# Patient Record
Sex: Female | Born: 1960 | Race: White | Hispanic: No | Marital: Married | Smoking: Never smoker
Health system: Southern US, Community
[De-identification: ages and names within clinical notes are randomized; demographics above are authoritative.]

## PROBLEM LIST (undated history)

## (undated) DIAGNOSIS — C801 Malignant (primary) neoplasm, unspecified: Secondary | ICD-10-CM

## (undated) DIAGNOSIS — Z86018 Personal history of other benign neoplasm: Secondary | ICD-10-CM

## (undated) DIAGNOSIS — I10 Essential (primary) hypertension: Secondary | ICD-10-CM

## (undated) DIAGNOSIS — T7840XA Allergy, unspecified, initial encounter: Secondary | ICD-10-CM

## (undated) DIAGNOSIS — T753XXA Motion sickness, initial encounter: Secondary | ICD-10-CM

## (undated) DIAGNOSIS — Z923 Personal history of irradiation: Secondary | ICD-10-CM

## (undated) HISTORY — DX: Allergy, unspecified, initial encounter: T78.40XA

## (undated) HISTORY — DX: Essential (primary) hypertension: I10

## (undated) HISTORY — PX: TONSILLECTOMY: SUR1361

## (undated) HISTORY — DX: Personal history of other benign neoplasm: Z86.018

---

## 1999-04-26 HISTORY — PX: HIATAL HERNIA REPAIR: SHX195

## 2004-06-07 ENCOUNTER — Ambulatory Visit: Payer: Self-pay | Admitting: Family Medicine

## 2004-07-21 ENCOUNTER — Ambulatory Visit: Payer: Self-pay | Admitting: Family Medicine

## 2005-04-06 ENCOUNTER — Ambulatory Visit: Payer: Self-pay | Admitting: Family Medicine

## 2005-04-13 ENCOUNTER — Ambulatory Visit: Payer: Self-pay | Admitting: Family Medicine

## 2005-04-25 HISTORY — PX: ABDOMINAL HYSTERECTOMY: SHX81

## 2005-10-03 ENCOUNTER — Ambulatory Visit: Payer: Self-pay | Admitting: Obstetrics and Gynecology

## 2006-10-06 ENCOUNTER — Ambulatory Visit: Payer: Self-pay | Admitting: Obstetrics and Gynecology

## 2007-01-18 ENCOUNTER — Inpatient Hospital Stay: Payer: Self-pay | Admitting: Internal Medicine

## 2007-01-18 ENCOUNTER — Other Ambulatory Visit: Payer: Self-pay

## 2007-02-14 ENCOUNTER — Ambulatory Visit: Payer: Self-pay | Admitting: Gastroenterology

## 2007-07-18 ENCOUNTER — Ambulatory Visit: Payer: Self-pay | Admitting: Family Medicine

## 2008-09-17 ENCOUNTER — Emergency Department: Payer: Self-pay | Admitting: Emergency Medicine

## 2008-09-24 ENCOUNTER — Ambulatory Visit: Payer: Self-pay | Admitting: Family Medicine

## 2008-11-10 ENCOUNTER — Ambulatory Visit: Payer: Self-pay | Admitting: Ophthalmology

## 2008-11-27 ENCOUNTER — Ambulatory Visit: Payer: Self-pay | Admitting: Obstetrics and Gynecology

## 2008-12-18 ENCOUNTER — Ambulatory Visit: Payer: Self-pay | Admitting: Gastroenterology

## 2009-04-01 ENCOUNTER — Ambulatory Visit: Payer: Self-pay | Admitting: Family Medicine

## 2009-04-25 HISTORY — PX: OTHER SURGICAL HISTORY: SHX169

## 2010-01-11 ENCOUNTER — Ambulatory Visit: Payer: Self-pay | Admitting: Obstetrics and Gynecology

## 2010-02-03 ENCOUNTER — Encounter: Payer: Self-pay | Admitting: Family Medicine

## 2010-02-23 ENCOUNTER — Encounter: Payer: Self-pay | Admitting: Family Medicine

## 2010-03-25 ENCOUNTER — Encounter: Payer: Self-pay | Admitting: Family Medicine

## 2010-05-13 ENCOUNTER — Encounter: Payer: Self-pay | Admitting: Family Medicine

## 2010-05-26 ENCOUNTER — Encounter: Payer: Self-pay | Admitting: Family Medicine

## 2010-06-24 ENCOUNTER — Encounter: Payer: Self-pay | Admitting: Family Medicine

## 2010-11-18 ENCOUNTER — Ambulatory Visit: Payer: Self-pay | Admitting: Rheumatology

## 2011-02-15 ENCOUNTER — Ambulatory Visit: Payer: Self-pay | Admitting: Obstetrics and Gynecology

## 2011-07-17 ENCOUNTER — Emergency Department: Payer: Self-pay | Admitting: Emergency Medicine

## 2011-07-17 LAB — COMPREHENSIVE METABOLIC PANEL
Albumin: 4 g/dL (ref 3.4–5.0)
Alkaline Phosphatase: 68 U/L (ref 50–136)
Anion Gap: 11 (ref 7–16)
Bilirubin,Total: 0.3 mg/dL (ref 0.2–1.0)
Calcium, Total: 9.1 mg/dL (ref 8.5–10.1)
Chloride: 105 mmol/L (ref 98–107)
Co2: 27 mmol/L (ref 21–32)
Creatinine: 0.69 mg/dL (ref 0.60–1.30)
EGFR (African American): >60
EGFR (Non-African Amer.): >60
Glucose: 110 mg/dL — ABNORMAL HIGH (ref 65–99)
Osmolality: 286 (ref 275–301)
Potassium: 3.5 mmol/L (ref 3.5–5.1)
SGOT(AST): 26 U/L (ref 15–37)
SGPT (ALT): 22 U/L
Total Protein: 7.3 g/dL (ref 6.4–8.2)

## 2011-07-17 LAB — URINALYSIS, COMPLETE
Bacteria: NONE SEEN
Ketone: NEGATIVE
Leukocyte Esterase: NEGATIVE
Nitrite: NEGATIVE
Ph: 6 (ref 4.5–8.0)
RBC,UR: <1 /HPF (ref 0–5)
Squamous Epithelial: 2

## 2011-07-17 LAB — CBC
HGB: 13.8 g/dL (ref 12.0–16.0)
MCH: 28.8 pg (ref 26.0–34.0)
MCHC: 33.2 g/dL (ref 32.0–36.0)
MCV: 87 fL (ref 80–100)
RBC: 4.8 10*6/uL (ref 3.80–5.20)

## 2011-07-17 LAB — HEMOGLOBIN: HGB: 13.8 g/dL (ref 12.0–16.0)

## 2011-07-17 LAB — HEMATOCRIT: HCT: 41.9 % (ref 35.0–47.0)

## 2011-07-20 ENCOUNTER — Ambulatory Visit: Payer: Self-pay | Admitting: Gastroenterology

## 2011-07-31 ENCOUNTER — Emergency Department: Payer: Self-pay | Admitting: Emergency Medicine

## 2011-07-31 LAB — CBC
HCT: 38.9 % (ref 35.0–47.0)
HGB: 13 g/dL (ref 12.0–16.0)
MCH: 28.9 pg (ref 26.0–34.0)
MCV: 87 fL (ref 80–100)
Platelet: 262 10*3/uL (ref 150–440)
RBC: 4.49 10*6/uL (ref 3.80–5.20)
WBC: 10.8 10*3/uL (ref 3.6–11.0)

## 2011-07-31 LAB — COMPREHENSIVE METABOLIC PANEL
Albumin: 3.6 g/dL (ref 3.4–5.0)
Alkaline Phosphatase: 88 U/L (ref 50–136)
Anion Gap: 10 (ref 7–16)
Bilirubin,Total: 0.2 mg/dL (ref 0.2–1.0)
Calcium, Total: 9.1 mg/dL (ref 8.5–10.1)
Chloride: 103 mmol/L (ref 98–107)
Co2: 27 mmol/L (ref 21–32)
Creatinine: 0.7 mg/dL (ref 0.60–1.30)
EGFR (African American): >60
EGFR (Non-African Amer.): >60
Osmolality: 279 (ref 275–301)
Potassium: 3.6 mmol/L (ref 3.5–5.1)
SGOT(AST): 24 U/L (ref 15–37)
Total Protein: 6.7 g/dL (ref 6.4–8.2)

## 2011-07-31 LAB — URINALYSIS, COMPLETE
Bacteria: NONE SEEN
Bilirubin,UR: NEGATIVE
Leukocyte Esterase: NEGATIVE
Nitrite: NEGATIVE
Ph: 6 (ref 4.5–8.0)
Protein: NEGATIVE
RBC,UR: 1 /HPF (ref 0–5)
Specific Gravity: 1.009 (ref 1.003–1.030)
Squamous Epithelial: 2
WBC UR: <1 /HPF (ref 0–5)

## 2011-07-31 LAB — LIPASE, BLOOD: Lipase: 201 U/L (ref 73–393)

## 2011-08-17 ENCOUNTER — Ambulatory Visit: Payer: Self-pay | Admitting: Gastroenterology

## 2011-10-04 ENCOUNTER — Ambulatory Visit: Payer: Self-pay | Admitting: Gynecologic Oncology

## 2011-10-05 LAB — CA 125: CA 125: 27.8 U/mL (ref 0.0–34.0)

## 2011-10-24 ENCOUNTER — Ambulatory Visit: Payer: Self-pay | Admitting: Gynecologic Oncology

## 2012-01-10 ENCOUNTER — Ambulatory Visit: Payer: Self-pay | Admitting: Orthopedic Surgery

## 2012-02-21 ENCOUNTER — Ambulatory Visit: Payer: Self-pay | Admitting: Pain Medicine

## 2012-03-20 ENCOUNTER — Ambulatory Visit: Payer: Self-pay | Admitting: Family Medicine

## 2012-03-28 ENCOUNTER — Ambulatory Visit: Payer: Self-pay | Admitting: Pain Medicine

## 2012-05-08 ENCOUNTER — Ambulatory Visit: Payer: Self-pay | Admitting: Pain Medicine

## 2012-05-28 ENCOUNTER — Ambulatory Visit: Payer: Self-pay | Admitting: Pain Medicine

## 2012-05-29 ENCOUNTER — Ambulatory Visit: Payer: Self-pay | Admitting: Pain Medicine

## 2013-04-01 ENCOUNTER — Ambulatory Visit: Payer: Self-pay | Admitting: Family Medicine

## 2013-05-03 ENCOUNTER — Ambulatory Visit: Payer: Self-pay | Admitting: Family Medicine

## 2013-09-02 ENCOUNTER — Emergency Department: Payer: Self-pay | Admitting: Emergency Medicine

## 2013-09-02 LAB — CBC WITH DIFFERENTIAL/PLATELET
BASOS PCT: 0.5 %
Basophil #: 0.1 10*3/uL (ref 0.0–0.1)
EOS ABS: 0.2 10*3/uL (ref 0.0–0.7)
EOS PCT: 1.1 %
HCT: 46.7 % (ref 35.0–47.0)
HGB: 15.1 g/dL (ref 12.0–16.0)
LYMPHS ABS: 2.7 10*3/uL (ref 1.0–3.6)
Lymphocyte %: 18.4 %
MCH: 28.3 pg (ref 26.0–34.0)
MCHC: 32.4 g/dL (ref 32.0–36.0)
MCV: 87 fL (ref 80–100)
MONOS PCT: 5 %
Monocyte #: 0.7 x10 3/mm (ref 0.2–0.9)
NEUTROS PCT: 75 %
Neutrophil #: 11.2 10*3/uL — ABNORMAL HIGH (ref 1.4–6.5)
PLATELETS: 282 10*3/uL (ref 150–440)
RBC: 5.36 10*6/uL — ABNORMAL HIGH (ref 3.80–5.20)
RDW: 14.6 % — ABNORMAL HIGH (ref 11.5–14.5)
WBC: 14.9 10*3/uL — ABNORMAL HIGH (ref 3.6–11.0)

## 2013-09-02 LAB — COMPREHENSIVE METABOLIC PANEL
ALBUMIN: 4 g/dL (ref 3.4–5.0)
Alkaline Phosphatase: 85 U/L
Anion Gap: 3 — ABNORMAL LOW (ref 7–16)
BUN: 10 mg/dL (ref 7–18)
Bilirubin,Total: 0.3 mg/dL (ref 0.2–1.0)
CALCIUM: 9.3 mg/dL (ref 8.5–10.1)
CHLORIDE: 105 mmol/L (ref 98–107)
CO2: 29 mmol/L (ref 21–32)
CREATININE: 0.78 mg/dL (ref 0.60–1.30)
EGFR (African American): >60
Glucose: 91 mg/dL (ref 65–99)
Osmolality: 272 (ref 275–301)
POTASSIUM: 4.3 mmol/L (ref 3.5–5.1)
SGOT(AST): 31 U/L (ref 15–37)
SGPT (ALT): 31 U/L (ref 12–78)
Sodium: 137 mmol/L (ref 136–145)
Total Protein: 7.3 g/dL (ref 6.4–8.2)

## 2013-09-02 LAB — URINALYSIS, COMPLETE
Bacteria: NONE SEEN
Bilirubin,UR: NEGATIVE
GLUCOSE, UR: NEGATIVE mg/dL (ref 0–75)
KETONE: NEGATIVE
LEUKOCYTE ESTERASE: NEGATIVE
Nitrite: NEGATIVE
Ph: 7 (ref 4.5–8.0)
Protein: NEGATIVE
SPECIFIC GRAVITY: 1.009 (ref 1.003–1.030)
WBC UR: 1 /HPF (ref 0–5)

## 2013-09-02 LAB — LIPASE, BLOOD: Lipase: 183 U/L (ref 73–393)

## 2014-04-14 ENCOUNTER — Emergency Department: Payer: Self-pay | Admitting: Emergency Medicine

## 2014-04-14 LAB — URINALYSIS, COMPLETE
BILIRUBIN, UR: NEGATIVE
BLOOD: NEGATIVE
Bacteria: NONE SEEN
GLUCOSE, UR: NEGATIVE mg/dL (ref 0–75)
KETONE: NEGATIVE
LEUKOCYTE ESTERASE: NEGATIVE
Nitrite: NEGATIVE
PH: 6 (ref 4.5–8.0)
RBC,UR: 4 /HPF (ref 0–5)
Specific Gravity: 1.023 (ref 1.003–1.030)
Squamous Epithelial: 5

## 2014-04-14 LAB — TROPONIN I

## 2014-04-14 LAB — CBC WITH DIFFERENTIAL/PLATELET
Basophil #: 0.1 10*3/uL (ref 0.0–0.1)
Basophil %: 0.4 %
Eosinophil #: 0.1 10*3/uL (ref 0.0–0.7)
Eosinophil %: 0.5 %
HCT: 47.8 % — AB (ref 35.0–47.0)
HGB: 15.5 g/dL (ref 12.0–16.0)
Lymphocyte #: 1.6 10*3/uL (ref 1.0–3.6)
Lymphocyte %: 10.4 %
MCH: 28.6 pg (ref 26.0–34.0)
MCHC: 32.5 g/dL (ref 32.0–36.0)
MCV: 88 fL (ref 80–100)
Monocyte #: 0.7 x10 3/mm (ref 0.2–0.9)
Monocyte %: 4.3 %
NEUTROS ABS: 12.8 10*3/uL — AB (ref 1.4–6.5)
Neutrophil %: 84.4 %
Platelet: 343 10*3/uL (ref 150–440)
RBC: 5.44 10*6/uL — ABNORMAL HIGH (ref 3.80–5.20)
RDW: 14.1 % (ref 11.5–14.5)
WBC: 15.2 10*3/uL — ABNORMAL HIGH (ref 3.6–11.0)

## 2014-04-14 LAB — COMPREHENSIVE METABOLIC PANEL
ALBUMIN: 4 g/dL (ref 3.4–5.0)
ALT: 20 U/L
Alkaline Phosphatase: 78 U/L
Anion Gap: 8 (ref 7–16)
BILIRUBIN TOTAL: 0.6 mg/dL (ref 0.2–1.0)
BUN: 14 mg/dL (ref 7–18)
Calcium, Total: 9.4 mg/dL (ref 8.5–10.1)
Chloride: 105 mmol/L (ref 98–107)
Co2: 26 mmol/L (ref 21–32)
Creatinine: 0.82 mg/dL (ref 0.60–1.30)
EGFR (African American): >60
GLUCOSE: 116 mg/dL — AB (ref 65–99)
Osmolality: 279 (ref 275–301)
POTASSIUM: 4 mmol/L (ref 3.5–5.1)
SGOT(AST): 26 U/L (ref 15–37)
SODIUM: 139 mmol/L (ref 136–145)
TOTAL PROTEIN: 7.3 g/dL (ref 6.4–8.2)

## 2014-04-14 LAB — LIPASE, BLOOD: LIPASE: 137 U/L (ref 73–393)

## 2014-07-15 ENCOUNTER — Ambulatory Visit: Payer: Self-pay | Admitting: Family Medicine

## 2014-09-26 ENCOUNTER — Telehealth: Payer: Self-pay | Admitting: Family Medicine

## 2014-09-26 NOTE — Telephone Encounter (Signed)
Patient does not have Xanax in all script or in Epic.

## 2014-09-26 NOTE — Telephone Encounter (Signed)
Pt is requesting a refill for the Rx Xanax.  [facility].  [phone] or [phone]

## 2014-09-26 NOTE — Telephone Encounter (Signed)
Please add medication.

## 2014-09-28 NOTE — Telephone Encounter (Signed)
Please call patient and clarify dose and frequency. Not in our records. Thanks.

## 2014-09-29 MED ORDER — ALPRAZOLAM 0.5 MG PO TABS: 0.5000 mg | ORAL_TABLET | Freq: Two times a day (BID) | ORAL | Status: DC | PRN

## 2014-09-29 NOTE — Telephone Encounter (Signed)
Please call patient and let her know we have printed rx.    Can not send to pharmacy.  Thanks.

## 2014-09-29 NOTE — Telephone Encounter (Signed)
Pt called back stating the doseage for Xanax 0.5 and as needed.  NK#[phone]/FX

## 2014-09-29 NOTE — Telephone Encounter (Signed)
I spoke with Ms. Kren, she says its a prescription from 2006.  She only uses it when she travels.  She does not have the RX with her, so she will call back with the dose and frequency.   Thanks,   -Mickel Baas

## 2014-09-30 ENCOUNTER — Other Ambulatory Visit: Payer: Self-pay | Admitting: Family Medicine

## 2014-09-30 DIAGNOSIS — F419 Anxiety disorder, unspecified: Secondary | ICD-10-CM

## 2014-09-30 MED ORDER — ALPRAZOLAM 0.5 MG PO TABS: 0.5000 mg | ORAL_TABLET | Freq: Two times a day (BID) | ORAL | Status: DC | PRN

## 2014-10-27 ENCOUNTER — Other Ambulatory Visit: Payer: Self-pay | Admitting: Family Medicine

## 2014-11-23 ENCOUNTER — Other Ambulatory Visit: Payer: Self-pay | Admitting: Family Medicine

## 2014-11-24 NOTE — Telephone Encounter (Signed)
Last Ov was on 06/12/2014

## 2014-12-17 ENCOUNTER — Other Ambulatory Visit: Payer: Self-pay | Admitting: Family Medicine

## 2014-12-17 DIAGNOSIS — J309 Allergic rhinitis, unspecified: Secondary | ICD-10-CM

## 2015-02-09 ENCOUNTER — Telehealth: Payer: Self-pay | Admitting: Family Medicine

## 2015-02-09 DIAGNOSIS — E213 Hyperparathyroidism, unspecified: Secondary | ICD-10-CM | POA: Insufficient documentation

## 2015-02-10 ENCOUNTER — Other Ambulatory Visit: Payer: Self-pay | Admitting: Family Medicine

## 2015-02-10 NOTE — Telephone Encounter (Signed)
Patient with elevated PTH, needs scan to see if over active  Parathyroid gland. Ordered and they will call patient. Please let her know. Also, is post-menopausal and no markers for arthritis. Please notify patient.  Thanks.

## 2015-02-10 NOTE — Telephone Encounter (Signed)
LMTCB 02/10/2015  Thanks,   -Laura 

## 2015-02-16 NOTE — Telephone Encounter (Signed)
Lmtcb-aa 

## 2015-02-16 NOTE — Telephone Encounter (Signed)
Pt advised-aa 

## 2015-02-17 ENCOUNTER — Encounter
Admission: RE | Admit: 2015-02-17 | Discharge: 2015-02-17 | Disposition: A | Discharge status: Home / Self Care | Payer: BLUE CROSS/BLUE SHIELD | Source: Ambulatory Visit | Attending: Family Medicine | Admitting: Family Medicine

## 2015-02-17 ENCOUNTER — Ambulatory Visit: Payer: Self-pay

## 2015-02-17 DIAGNOSIS — E213 Hyperparathyroidism, unspecified: Secondary | ICD-10-CM | POA: Diagnosis not present

## 2015-02-17 MED ORDER — TECHNETIUM TC 99M SESTAMIBI - CARDIOLITE
26.3440 | Freq: Once | INTRAVENOUS | Status: AC | PRN
  Administered 2015-02-17: 26.344 via INTRAVENOUS

## 2015-02-18 ENCOUNTER — Telehealth: Payer: Self-pay

## 2015-02-18 DIAGNOSIS — E213 Hyperparathyroidism, unspecified: Secondary | ICD-10-CM

## 2015-02-18 NOTE — Telephone Encounter (Signed)
Pt advised as directed below; lab sheet is at the front desk.   Thanks,   -Mickel Baas

## 2015-02-18 NOTE — Telephone Encounter (Signed)
-----   Message from Margarita Rana, MD sent at 02/17/2015  3:57 PM EDT ----- Negative for parathyroid adenoma. Would recheck PTH and will need endocrinology referral if still elevated. Thanks.

## 2015-02-21 ENCOUNTER — Other Ambulatory Visit: Payer: Self-pay | Admitting: Family Medicine

## 2015-02-21 DIAGNOSIS — I10 Essential (primary) hypertension: Secondary | ICD-10-CM

## 2015-02-21 LAB — PTH, INTACT AND CALCIUM
CALCIUM: 10.5 mg/dL — AB (ref 8.7–10.2)
PTH: 85 pg/mL — ABNORMAL HIGH (ref 15–65)

## 2015-02-23 DIAGNOSIS — I10 Essential (primary) hypertension: Secondary | ICD-10-CM | POA: Insufficient documentation

## 2015-02-24 ENCOUNTER — Telehealth: Payer: Self-pay

## 2015-02-24 ENCOUNTER — Encounter: Payer: Self-pay | Admitting: Family Medicine

## 2015-02-24 ENCOUNTER — Telehealth: Payer: Self-pay | Admitting: Family Medicine

## 2015-02-24 NOTE — Telephone Encounter (Signed)
Pt states she would like to know what her blood test results are from last Friday 02-20-15 if possible. CC

## 2015-02-24 NOTE — Telephone Encounter (Signed)
Pt advised;  She is going to call back with the Endocrinologist that she wants to see.   Thanks,   -Mickel Baas

## 2015-02-24 NOTE — Telephone Encounter (Signed)
-----   Message from Margarita Rana, MD sent at 02/21/2015 10:04 AM EDT ----- Calcium elevated and PTH still elevated. Needs referral to endocrinology. Please notify patient and put in order. Thanks.

## 2015-02-24 NOTE — Telephone Encounter (Signed)
Pt advised of lab work. ° °Thanks,  ° °-Hodan Wurtz  °

## 2015-02-25 ENCOUNTER — Telehealth: Payer: Self-pay | Admitting: Family Medicine

## 2015-02-25 DIAGNOSIS — E349 Endocrine disorder, unspecified: Secondary | ICD-10-CM

## 2015-02-25 NOTE — Telephone Encounter (Signed)
Pt states that she is supposed to be referred to an endocrinologist.Please add referral to EPIC.She would like to see Dr Gabriel Carina

## 2015-02-25 NOTE — Telephone Encounter (Signed)
Which diagnosis should we use to associate endocrinology referral? Elevated PTH? Please advise. Thanks!

## 2015-02-25 NOTE — Telephone Encounter (Signed)
Yes. Thanks 

## 2015-02-26 NOTE — Telephone Encounter (Signed)
Order for endocrinology referral added in EPIC. Thanks!

## 2015-03-23 ENCOUNTER — Telehealth: Payer: Self-pay

## 2015-03-23 MED ORDER — METOPROLOL SUCCINATE ER 50 MG PO TB24: 50.0000 mg | ORAL_TABLET | Freq: Every day | ORAL | Status: DC

## 2015-03-23 NOTE — Telephone Encounter (Signed)
Patient reports that she would like to increase Metoprolol. [facility] Marina Gravel!

## 2015-03-23 NOTE — Telephone Encounter (Signed)
Can increase Metoprolol as is on low dose or add low dose amlodipine instead. Thanks.

## 2015-03-23 NOTE — Telephone Encounter (Signed)
Pt reports Dr. Gabriel Carina advised pt to D/C HCTZ (as well as all other diuretics) due to "mild hyperparathyroidism". Pt is concerned because her BP has been elevated. It was 164/101 this morning. Pt reports Dr. Gabriel Carina is aware of her being elevated. Please advise. Renaldo Fiddler, CMA

## 2015-06-08 ENCOUNTER — Ambulatory Visit (INDEPENDENT_AMBULATORY_CARE_PROVIDER_SITE_OTHER): Payer: BLUE CROSS/BLUE SHIELD | Admitting: Family Medicine

## 2015-06-08 ENCOUNTER — Encounter: Payer: Self-pay | Admitting: Family Medicine

## 2015-06-08 VITALS — BP 148/96 | HR 64 | Temp 98.5°F | Resp 16 | Ht 65.0 in | Wt 146.0 lb

## 2015-06-08 DIAGNOSIS — K589 Irritable bowel syndrome without diarrhea: Secondary | ICD-10-CM | POA: Diagnosis not present

## 2015-06-08 DIAGNOSIS — J302 Other seasonal allergic rhinitis: Secondary | ICD-10-CM | POA: Insufficient documentation

## 2015-06-08 DIAGNOSIS — M1990 Unspecified osteoarthritis, unspecified site: Secondary | ICD-10-CM | POA: Insufficient documentation

## 2015-06-08 DIAGNOSIS — I10 Essential (primary) hypertension: Secondary | ICD-10-CM | POA: Diagnosis not present

## 2015-06-08 DIAGNOSIS — K922 Gastrointestinal hemorrhage, unspecified: Secondary | ICD-10-CM | POA: Insufficient documentation

## 2015-06-08 DIAGNOSIS — H04129 Dry eye syndrome of unspecified lacrimal gland: Secondary | ICD-10-CM | POA: Insufficient documentation

## 2015-06-08 DIAGNOSIS — K219 Gastro-esophageal reflux disease without esophagitis: Secondary | ICD-10-CM | POA: Insufficient documentation

## 2015-06-08 MED ORDER — METOPROLOL SUCCINATE ER 50 MG PO TB24: 50.0000 mg | ORAL_TABLET | Freq: Every day | ORAL | Status: DC

## 2015-06-08 MED ORDER — LISINOPRIL 10 MG PO TABS: 10.0000 mg | ORAL_TABLET | Freq: Every day | ORAL | Status: DC

## 2015-06-08 NOTE — Progress Notes (Signed)
Patient ID: RENELLA Bradshaw, female   DOB: 1960-12-31, 55 y.o.   MRN: [id number]         Patient: Jessica Bradshaw Female    DOB: 1960/11/05   55 y.o.   MRN: [id number] Visit Date: 06/08/2015  Today's Provider: Margarita Rana, MD   Chief Complaint  Patient presents with  . Hypertension   Subjective:    Hypertension This is a chronic problem. The problem has been gradually improving since onset. The problem is uncontrolled. Associated symptoms include headaches. Pertinent negatives include no chest pain, malaise/fatigue, neck pain, orthopnea, palpitations, peripheral edema, shortness of breath or sweats.   Pt reports D/C'ing HCTZ and increasing Metoprolol from 16m to 51msecondary to Hyperparathyroidism.  Pt says her blood pressure have been elevated since the medications change.  (180's-190's over high 90's low 100's).  [facility] double her Metoprolol to 10090m She says her blood pressure is still running high.      Allergies  Allergen Reactions  . Esomeprazole     Other reaction(s): Chest pain, Cough   Previous Medications   ALPRAZOLAM (XANAX) 0.5 MG TABLET    Take 1 tablet (0.5 mg total) by mouth 2 (two) times daily as needed for anxiety.   FLUTICASONE (FLONASE) 50 MCG/ACT NASAL SPRAY    USE 2 SPRAYS IN EACH NOSTRIL ONCE DAILY   LINZESS 290 MCG CAPS CAPSULE    TAKE 1 CAPSULE BY MOUTH ONCE DAILY.   METOPROLOL SUCCINATE (TOPROL-XL) 100 MG 24 HR TABLET    Take 100 mg by mouth daily. Take with or immediately following a meal.   METOPROLOL SUCCINATE (TOPROL-XL) 50 MG 24 HR TABLET    Take 1 tablet (50 mg total) by mouth daily. Take with or immediately following a meal.   MONTELUKAST (SINGULAIR) 10 MG TABLET    TAKE 1 TABLET EVERY DAY   XIIDRA 5 % SOLN    USE 1 DROP IN BOTH EYES 2 TIMES A DAY    Review of Systems  Constitutional: Positive for unexpected weight change (Pt reports gaining 10 pounds in two days three weeks ago.   She says she hasn't lost it. ). Negative for fever, chills, malaise/fatigue, diaphoresis, activity change, appetite change and fatigue.  Respiratory: Positive for chest tightness. Negative for apnea, cough, choking, shortness of breath, wheezing and stridor.   Cardiovascular: Negative for chest pain, palpitations, orthopnea and leg swelling.  Gastrointestinal: Positive for nausea, abdominal pain and constipation. Negative for vomiting, diarrhea, blood in stool, anal bleeding and rectal pain.  Musculoskeletal: Negative.  Negative for neck pain.  Neurological: Positive for headaches. Negative for dizziness and light-headedness.    Social History  Substance Use Topics  . Smoking status: Never Smoker   . Smokeless tobacco: Never Used  . Alcohol Use: No   Objective:   BP 148/96 mmHg  Pulse 64  Temp(Src) 98.5 F (36.9 C) (Oral)  Resp 16  Ht 5' 5"  (1.651 m)  Wt 146 lb (66.225 kg)  BMI 24.30 kg/m2  Physical Exam  Constitutional: She is oriented to person, place, and time. She appears well-developed and well-nourished.  Cardiovascular: Normal rate, regular rhythm and normal heart sounds.   Pulmonary/Chest: Effort normal and breath sounds normal.  Neurological: She is alert and oriented to person, place, and time.  Psychiatric: She has a normal mood and affect. Her behavior is normal. Judgment and thought content normal.        Assessment & Plan:  1. IBS (irritable bowel syndrome) Followed by [facility].  Will return to see them at the end of the month.    2. Essential hypertension BP still elevated, will decrease Metoprolol back to 30m and start Lisinopril 13m  Continue to monitor at home and recheck a Met C in about a month.    - lisinopril (PRINIVIL,ZESTRIL) 10 MG tablet; Take 1 tablet (10 mg total) by mouth daily.  Dispense: 90 tablet; Refill: 1 - metoprolol succinate (TOPROL-XL) 50 MG 24 hr tablet; Take 1 tablet (50 mg total) by mouth daily. Take with or immediately  following a meal.  Dispense: 90 tablet; Refill: 1     Patient was seen and examined by NaJerrell BelfastMD, and note scribed by LaAshley RoyaltyCMA.  I have reviewed the document for accuracy and completeness and I agree with above. - NaJerrell BelfastMD   NaMargarita RanaMD  [facility]

## 2015-06-13 ENCOUNTER — Telehealth: Payer: Self-pay | Admitting: Emergency Medicine

## 2015-06-13 NOTE — Telephone Encounter (Signed)
Pt called because she was seen earlier this week for elevated BP. She was started on Lisinopril 10 mg. Today she reports that her BP is too low. She was instructed to cut her Metoprolol in half and if it was still too low then to cut the Lisinopril in half. Pt voiced understanding.

## 2015-07-01 ENCOUNTER — Telehealth: Payer: Self-pay | Admitting: Family Medicine

## 2015-07-01 DIAGNOSIS — I10 Essential (primary) hypertension: Secondary | ICD-10-CM

## 2015-07-01 MED ORDER — LISINOPRIL 20 MG PO TABS: 20.0000 mg | ORAL_TABLET | Freq: Every day | ORAL | Status: DC

## 2015-07-01 NOTE — Telephone Encounter (Signed)
LMTCB 07/01/2015  Thanks,   -Mickel Baas

## 2015-07-01 NOTE — Telephone Encounter (Signed)
Pt stated that she is taking the full dose of both metoprolol succinate (TOPROL-XL) 50 MG 24 hr tablet & lisinopril (PRINIVIL,ZESTRIL) 10 MG tablet and she doesn't feel like her BP has changed much. Pt stated it stays b/t 144/88 & 144/100. Pt wanted to see if she should make any changes to her medication. Thanks TNP

## 2015-07-01 NOTE — Telephone Encounter (Signed)
Pt advised-aa 

## 2015-07-01 NOTE — Telephone Encounter (Signed)
LOV was 06/08/2015 and BP was 148/96. Pt had to D/C HCTZ due to hyperparathyroidism. Please advise. Renaldo Fiddler, CMA

## 2015-07-01 NOTE — Telephone Encounter (Signed)
Increase Lisinopril to 20 mg.  Continue to monitor. Thanks.

## 2015-07-18 ENCOUNTER — Other Ambulatory Visit: Payer: Self-pay | Admitting: Family Medicine

## 2015-07-18 DIAGNOSIS — J302 Other seasonal allergic rhinitis: Secondary | ICD-10-CM

## 2015-07-20 ENCOUNTER — Telehealth: Payer: Self-pay | Admitting: Family Medicine

## 2015-07-20 DIAGNOSIS — I10 Essential (primary) hypertension: Secondary | ICD-10-CM

## 2015-07-20 MED ORDER — LOSARTAN POTASSIUM 50 MG PO TABS: 50.0000 mg | ORAL_TABLET | Freq: Every day | ORAL | Status: DC

## 2015-07-20 NOTE — Telephone Encounter (Signed)
Pt advised.  [facility].  Thanks,   -Paddy Walthall  

## 2015-07-20 NOTE — Telephone Encounter (Signed)
Pt stated that she has developed a cough and believes that it is a side effect of taking lisinopril (PRINIVIL,ZESTRIL) 20 MG tablet. Pt would like something else sent to [facility] Abbott Pao was advised that Dr. Venia Minks is out of the office this week. Please advise. Thanks TNP

## 2015-07-20 NOTE — Telephone Encounter (Signed)
Please review.    Thanks,   -Enzo Treu  

## 2015-07-20 NOTE — Telephone Encounter (Signed)
Losartan 50 mg daily

## 2015-07-27 ENCOUNTER — Telehealth: Payer: Self-pay | Admitting: Family Medicine

## 2015-07-27 DIAGNOSIS — K589 Irritable bowel syndrome without diarrhea: Secondary | ICD-10-CM

## 2015-07-27 DIAGNOSIS — K5909 Other constipation: Secondary | ICD-10-CM

## 2015-07-27 NOTE — Telephone Encounter (Signed)
Pt stated she was referred to Dr. Marijo File at the [facility] in [place]. Pt stated she was then referred to Dr. Jerl Mina a Pelvic Health Physical Therapist with [facility] for constipation. Pt stated that the hospital won't accept an out of state referral and pt would like Dr. Venia Minks to send the referral. Pt stated she would be happy to fax her records from [facility] if we need them. Pt stated the referral needs to be faxed to [phone]. Please advise. Thanks TNP

## 2015-07-28 NOTE — Telephone Encounter (Signed)
Ok to refer. Thanks

## 2015-07-28 NOTE — Telephone Encounter (Signed)
That is correct. Thanks

## 2015-07-28 NOTE — Telephone Encounter (Signed)
GI referral for IBS. [facility] was referring pt to Dr. Jerl Mina for constipation. Ok to put in this referral? Jessica Bradshaw, CMA

## 2015-07-28 NOTE — Telephone Encounter (Signed)
Hanley Seamen is a physical therapist at [facility]

## 2015-07-28 NOTE — Telephone Encounter (Signed)
It looks like from the note that pt is requesting referral for physical therapy.The order in EPIC is for GI.Is this correct ?

## 2015-07-29 DIAGNOSIS — K5909 Other constipation: Secondary | ICD-10-CM | POA: Insufficient documentation

## 2015-07-29 NOTE — Telephone Encounter (Signed)
Pt called to get an update on her referral to for physical therapy (please see first message taken on 07/27/15). I spoke with Judson Roch and she had sent over the GI referral but pt is needing it to be for physical therapy. Please advise. Thanks TNP

## 2015-07-29 NOTE — Telephone Encounter (Signed)
PT referral ordered. Emily Drozdowski, CMA  

## 2015-08-12 ENCOUNTER — Encounter: Payer: Self-pay | Admitting: Family Medicine

## 2015-08-12 ENCOUNTER — Telehealth: Payer: Self-pay | Admitting: Family Medicine

## 2015-08-12 ENCOUNTER — Ambulatory Visit (INDEPENDENT_AMBULATORY_CARE_PROVIDER_SITE_OTHER): Payer: BLUE CROSS/BLUE SHIELD | Admitting: Family Medicine

## 2015-08-12 VITALS — BP 122/70 | HR 72 | Temp 98.5°F | Resp 16 | Wt 144.0 lb

## 2015-08-12 DIAGNOSIS — K589 Irritable bowel syndrome without diarrhea: Secondary | ICD-10-CM

## 2015-08-12 DIAGNOSIS — K648 Other hemorrhoids: Secondary | ICD-10-CM

## 2015-08-12 DIAGNOSIS — K644 Residual hemorrhoidal skin tags: Secondary | ICD-10-CM

## 2015-08-12 MED ORDER — HYDROCORTISONE 2.5 % RE CREA: 1.0000 "application " | TOPICAL_CREAM | Freq: Two times a day (BID) | RECTAL | Status: DC

## 2015-08-12 NOTE — Telephone Encounter (Signed)
Could not find a covered med. Thanks.

## 2015-08-12 NOTE — Telephone Encounter (Signed)
[facility] is stating that the Proctosol 2.5% rectal cream is not covered under the patient's plan. She is wondering if there is a different med that you can prescribe for the patient. Patient is at the pharmacy now and wants to wait until something is heard back from Dr. Venia Minks. Advised the pharmacist that Dr. was busy with patients but she insisted that Dr. or nurse give call back ASAP. Ronny Bacon --[phone]

## 2015-08-12 NOTE — Progress Notes (Signed)
Subjective:    Patient ID: Jessica Bradshaw, female    DOB: Dec 13, 1960, 55 y.o.   MRN: [id number]  HPI  Hemorrhoids: Patient complains of evaluation of possible hemorrhoids. Onset of symptoms was abrupt starting 5 months ago ago with gradually worsening course since that time.  She describes symptoms as anorectal itching, pain with sitting and painful defecation. Treatment to date has been Preparation H, sitz baths, witch hazel. Patient denies family hx of colorectal CA, history of previous STDs, known or suspected STD exposure, maroon colored stools, melena, receptive anal intercourse and weight loss.  Still with constipation on Linzess and Miralax. Is going to see a pelvic floor specialist next month.  May be making her hemorrhoids worse.   Review of Systems  Constitutional: Negative for fever, chills, diaphoresis, activity change, appetite change, fatigue and unexpected weight change.  Gastrointestinal: Positive for constipation (controlled on Linzess or Miralax.  ) and rectal pain. Negative for blood in stool and anal bleeding. Diarrhea: controlled on Linzess.   BP 122/70 mmHg  Pulse 72  Temp(Src) 98.5 F (36.9 C) (Oral)  Resp 16  Wt 144 lb (65.318 kg)   Patient Active Problem List   Diagnosis Date Noted  . Chronic constipation 07/29/2015  . Dry eye syndrome 06/08/2015  . Acid reflux 06/08/2015  . Arthritis, degenerative 06/08/2015  . Allergic rhinitis, seasonal 06/08/2015  . Gastrointestinal bleeding, upper 06/08/2015  . IBS (irritable bowel syndrome) 06/08/2015  . BP (high blood pressure) 02/23/2015  . Hyperparathyroidism ([place]) 02/09/2015   No past medical history on file. Current Outpatient Prescriptions on File Prior to Visit  Medication Sig  . ALPRAZolam (XANAX) 0.5 MG tablet Take 1 tablet (0.5 mg total) by mouth 2 (two) times daily as needed for anxiety.  . fluticasone (FLONASE) 50 MCG/ACT nasal spray USE 2 SPRAYS IN EACH NOSTRIL ONCE DAILY  . LINZESS 290 MCG  CAPS capsule TAKE 1 CAPSULE BY MOUTH ONCE DAILY.  Marland Kitchen losartan (COZAAR) 50 MG tablet Take 1 tablet (50 mg total) by mouth daily.  . metoprolol succinate (TOPROL-XL) 50 MG 24 hr tablet Take 1 tablet (50 mg total) by mouth daily. Take with or immediately following a meal.  . montelukast (SINGULAIR) 10 MG tablet TAKE 1 TABLET EVERY DAY   No current facility-administered medications on file prior to visit.   Allergies  Allergen Reactions  . Esomeprazole     Other reaction(s): Chest pain, Cough   Past Surgical History  Procedure Laterality Date  . Tonsillectomy    . Cesarean section  1985  . Abdominal hysterectomy  2007  . Hiatal hernia repair  2001   Social History   Social History  . Marital Status: Married    Spouse Name: N/A  . Number of Children: 3  . Years of Education: College   Occupational History  . Part Time    Social History Main Topics  . Smoking status: Never Smoker   . Smokeless tobacco: Never Used  . Alcohol Use: No  . Drug Use: No  . Sexual Activity: Not on file   Other Topics Concern  . Not on file   Social History Narrative   Family History  Problem Relation Age of Onset  . Osteoporosis Mother   . Diabetes Father   . Heart disease Father   . Congestive Heart Failure Father   . Hypertension Father   . Stroke Father   . Heart attack Father   . Glaucoma Father   . AAA (abdominal aortic aneurysm)  Son   . HIV Brother   . Hypertension Brother   . Hypertension Brother   . Drug abuse Maternal Uncle       Objective:   Physical Exam  Constitutional: She is oriented to person, place, and time. She appears well-developed and well-nourished.  Neurological: She is alert and oriented to person, place, and time.  Psychiatric: She has a normal mood and affect. Her behavior is normal. Judgment and thought content normal.   BP 122/70 mmHg  Pulse 72  Temp(Src) 98.5 F (36.9 C) (Oral)  Resp 16  Wt 144 lb (65.318 kg)     Assessment & Plan:  1. External  hemorrhoids New problem. Worsening. Did see surgeon at [facility] who told her they were too small for them to treat. Will treat as below.  Patient instructed to call back if condition worsens or does not improve.    - hydrocortisone (ANUSOL-HC) 2.5 % rectal cream; Place 1 application rectally 2 (two) times daily.  Dispense: 30 g; Refill: 0  2. IBS (irritable bowel syndrome) Improved but not controlled on Miralax and Linzess.  Continue for now.    Margarita Rana, MD

## 2015-09-04 ENCOUNTER — Encounter: Payer: Self-pay | Admitting: Physical Therapy

## 2015-09-04 ENCOUNTER — Ambulatory Visit: Payer: BLUE CROSS/BLUE SHIELD | Attending: Family Medicine | Admitting: Physical Therapy

## 2015-09-04 DIAGNOSIS — M6281 Muscle weakness (generalized): Secondary | ICD-10-CM

## 2015-09-04 DIAGNOSIS — R279 Unspecified lack of coordination: Secondary | ICD-10-CM | POA: Diagnosis not present

## 2015-09-04 DIAGNOSIS — R293 Abnormal posture: Secondary | ICD-10-CM

## 2015-09-04 NOTE — Therapy (Addendum)
[place] MAIN [facility] SERVICES [address] [place], [place], [address] Phone: [phone]   Fax:  [phone]  Physical Therapy Evaluation  Patient Details  Name: Jessica Bradshaw MRN: [id number] Date of Birth: 27-Feb-1961 Referring Provider: Venia Minks  Encounter Date: 09/04/2015      PT End of Session - 09/07/15 [id number]    Visit Number 1   Number of Visits 12   Date for PT Re-Evaluation 11/23/15   PT Start Time 1310   PT Stop Time 1408   PT Time Calculation (min) 58 min   Activity Tolerance Patient tolerated treatment well;No increased pain   Behavior During Therapy [facility] for tasks assessed/performed      Past Medical History  Diagnosis Date  . Hypertension   . Allergy     Past Surgical History  Procedure Laterality Date  . Tonsillectomy    . Abdominal hysterectomy  2007  . Hiatal hernia repair  2001  . Cesarean section  1985  . Transoral incisionless fundoplication  [id number]    There were no vitals filed for this visit.       Subjective Assessment - 09/07/15 0833    Subjective 1) Pt was diagnosed with IBS - constipation 3 years ago but has had constipation as a kid.  This Sx has worsened with the past 6 years. Pt reports straining to evacaute all of her life but currently with intake of laxatives: (Lincess and Miralex) she no longer has to strain and has 1 BM daily. Without medications, pt's bowel schedule involves 1 BM with small rock formation once a day. Pt's comfortable bowel movement Type is 6-7.  When she has Type 4-5, she gets "backed up" and has been  hospitalized 1-2 x / year. Daily fluid intake : 24 fl oz of water, 16 fl oz of decaf tea, 8 fl oz coffee, 6 floz orange juice.  Diet:  Special Carbohydrate Diet for SIBO. But she finds this diet difficult to be in social environment.  2) LBP since 3-4 months after undergoing PT and dry needling Tx for B hip pain R > L.  The PT who treated her told her that her L side is tighter.  The LBP hurts with prolonged sitting > 1 hr, walking 1 mile. Pt stopped walking 3-6 miles day , 5-7 x week due to pain.  Tenderness to touch along R lateral thigh.  Pt is no longer getting PT.      Pertinent History Hx of fibroids, leading hysterectomy (abdominal). also TIF, and emergency C-section. SIBO 3x with antibiotics Tx in the past. 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  Pt injuried her neck (herniated disk) with lifting son.    Patient Stated Goals return to being able to active without pain (walking, treadmilling, gym classes)             [facility] PT Assessment - 09/07/15 0814    Assessment   Medical Diagnosis constipation   Referring Provider Venia Minks   Precautions   Precautions None   Restrictions   Weight Bearing Restrictions No   Balance Screen   Has the patient fallen in the past 6 months No   Observation/Other Assessments   Observations slumped sitting    Coordination   Gross Motor Movements are Fluid and Coordinated --  limited diaphragmatic breathing , excursion limited   Posture/Postural Control   Posture Comments lumbopelvic instability with hip flexion in hooklying  AROM   Overall AROM Comments hip IR on L limited, sideflexion compensation   Palpation   Palpation comment increased scar restriction distal portion of longititudinal scar over abdomen                 Pelvic Floor Special Questions - 09/07/15 0817    Diastasis Recti 3 fingers below sternum, 2.5 above umbilicus   External Perineal Exam through clothing   External Palpation R coccygeus, L ischiocavernosus w/ tenderness           [facility] Adult PT Treatment/Exercise - 09/07/15 0814    Therapeutic Activites    Therapeutic Activities --  proper sitting posture   Neuro Re-ed    Neuro Re-ed Details  anatomy/physiology deep core system, breathing coordination with pelvic floor                 PT Education - 09/07/15 [id number]     Education provided Yes   Education Details POC, anatomy/physiology, HEP, pelvic health PT   Person(s) Educated Patient   Methods Explanation;Demonstration;Tactile cues;Verbal cues;Handout   Comprehension Returned demonstration;Verbalized understanding             PT Long Term Goals - 09/07/15 0826    PT LONG TERM GOAL #1   Title Pt will report Type 4-5 bowel movements 75% of the time with 50%  decreased laxative use across 1 week in order to restore healthy GI and bowel function.   Time 12   Period Weeks   Status New   PT LONG TERM GOAL #2   Title Pt will be compliant with increasing her water intake and decaf drinks/ other acidic drinks to 1:1 ratio in order to increase hydration and decrease constipation.    Time 12   Period Weeks   Status New   PT LONG TERM GOAL #3   Title Pt will demo < 2 fingers width separation below sternum and above umbilicus in order to increase intraabdominal pressure for improve LBP and ability to withstand load with exercises.    Time 12   Period Weeks   Status New   PT LONG TERM GOAL #4   Title Pt will decrease her LBP score from     %    to  <     % in order to return to ADLs.   Period Weeks   Status New   PT LONG TERM GOAL #5   Title Pt will demo B hip PROM IR in hooklying in order to return to walking and squatting with exercises/ ADLs.   Time 12   Period Weeks   Status New   Additional Long Term Goals   Additional Long Term Goals Yes   PT LONG TERM GOAL #6   Title Pt will decrease her COREFO score   % to   <    %  in order to return to regular bowel movements.   Time 12   Period Weeks   Status New               Plan - 09/07/15 [id number]    Clinical Impression Statement Pt is a 55 yo female who complains of chronic constipation and low back pain. These deficits impact her QOL, limit her ability to perform exercises, and have contributed visits to ED in the past. Pt's clinical presentations include abdominal scar adhesions, poor deep  core mm system coordination and strength, limited hip ROM , pelvic floor mm tensions/tenderness, poor posture and body  mechanics.  Patient will benefit from skilled therapeutic intervention in order to improve the following deficits and impairments:  Increased fascial restricitons, Improper body mechanics, Pain, Decreased scar mobility, Increased muscle spasms, Postural dysfunction, Decreased endurance, Decreased range of motion, Decreased strength, Impaired flexibility, Decreased mobility, Decreased coordination, Decreased activity tolerance   Rehab Potential Good   Clinical Impairments Affecting Rehab Potential multiple abdominal surgeries, chronicity of Sx   PT Frequency 1x / week   PT Duration 12 weeks   PT Treatment/Interventions ADLs/Self Care Home Management;Aquatic Therapy;Biofeedback;Electrical Stimulation;Cryotherapy;Gait training;Moist Heat;Stair training;Functional mobility training;Therapeutic activities;Therapeutic exercise;Balance training;Neuromuscular re-education;Manual techniques;Energy conservation;Dry needling;Scar mobilization;Patient/family education;Manual lymph drainage   Consulted and Agree with Plan of Care Patient      Patient will benefit from skilled therapeutic intervention in order to improve the following deficits and impairments:  Increased fascial restricitons, Improper body mechanics, Pain, Decreased scar mobility, Increased muscle spasms, Postural dysfunction, Decreased endurance, Decreased range of motion, Decreased strength, Impaired flexibility, Decreased mobility, Decreased coordination, Decreased activity tolerance  Visit Diagnosis: Unspecified lack of coordination  Muscle weakness (generalized)  Abnormal posture     Problem List Patient Active Problem List   Diagnosis Date Noted  . Chronic constipation 07/29/2015  . Dry eye syndrome 06/08/2015  . Acid reflux 06/08/2015  . Arthritis, degenerative 06/08/2015  . Allergic rhinitis, seasonal  06/08/2015  . Gastrointestinal bleeding, upper 06/08/2015  . IBS (irritable bowel syndrome) 06/08/2015  . BP (high blood pressure) 02/23/2015  . Hyperparathyroidism ([place]) 02/09/2015    Jerl Mina ,PT, DPT, E-RYT  09/07/2015, 8:34 AM  [place] MAIN [facility] SERVICES [address] [place], [place], [address] Phone: [phone]   Fax:  [phone]  Name: ELENER MUNUZ MRN: [id number] Date of Birth: May 21, 1960

## 2015-09-04 NOTE — Patient Instructions (Signed)
You are now ready to begin training the deep core muscles system: diaphragm, transverse abdominis, pelvic floor . These muscles must work together as a team.       The key to these exercises to train the brain to coordinate the timing of these muscles and to have them turn on for long periods of time to hold you upright against gravity (especially important if you are on your feet all day).These muscles are postural muscles and play a role stabilizing your spine and bodyweight. By doing these repetitions slowly and correctly instead of doing crunches, you will achieve a flatter belly without a lower pooch. You are also placing your spine in a more neutral position and breathing properly which in turn, decreases your risk for problems related to your pelvic floor, abdominal, and low back such as pelvic organ prolapse, hernias, diastasis recti (separation of superficial muscles), disk herniations, spinal fractures. These exercises set a solid foundation for you to later progress to resistance/ strength training with therabands and weights and return to other typical fitness exercises with a stronger deeper core.  DO level 1 (10 x 3  reps in morning and evening)     Breathing on toilet to relax pelvic floor muscles   Proper sitting posture ( catch yourself, feet under knees)

## 2015-09-07 ENCOUNTER — Ambulatory Visit: Payer: BLUE CROSS/BLUE SHIELD | Admitting: Physical Therapy

## 2015-09-07 DIAGNOSIS — R279 Unspecified lack of coordination: Secondary | ICD-10-CM | POA: Diagnosis not present

## 2015-09-07 DIAGNOSIS — M6281 Muscle weakness (generalized): Secondary | ICD-10-CM

## 2015-09-07 DIAGNOSIS — R293 Abnormal posture: Secondary | ICD-10-CM

## 2015-09-07 NOTE — Therapy (Signed)
[place] MAIN [facility] SERVICES [address] [place], [place], [address] Phone: [phone]   Fax:  [phone]  Physical Therapy Treatment  Patient Details  Name: Jessica Bradshaw MRN: [id number] Date of Birth: 1960-08-11 Referring Provider: Venia Minks  Encounter Date: 09/07/2015      PT End of Session - 09/07/15 1731    Visit Number 2   Number of Visits 12   Date for PT Re-Evaluation 11/23/15   PT Start Time [id number]   PT Stop Time [id number]   PT Time Calculation (min) 55 min   Activity Tolerance Patient tolerated treatment well;No increased pain   Behavior During Therapy [facility] for tasks assessed/performed      Past Medical History  Diagnosis Date  . Hypertension   . Allergy     Past Surgical History  Procedure Laterality Date  . Tonsillectomy    . Abdominal hysterectomy  2007  . Hiatal hernia repair  2001  . Cesarean section  1985  . Transoral incisionless fundoplication  [id number]    There were no vitals filed for this visit.      Subjective Assessment - 09/07/15 1757    Subjective Pt reported she felt hopeful after her first visit. Pt has been practicing proper sitting posture. Pt stated her back has been hurting and it wraps around her back instead of one-sided.    Pertinent History Hx of fibroids, leading hysterectomy (abdominal). also TIF, and emergency C-section. SIBO 3x with antibiotics Tx in the past. 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  Pt injuried her neck (herniated disk) with lifting son.    Patient Stated Goals return to being able to active without pain (walking, treadmilling, gym classes)             [facility] PT Assessment - 09/07/15 1748    Observation/Other Assessments   Observations able to find pelvic neutral    Other Surveys  --  ODI 24%, COREFO 26%    Coordination   Gross Motor Movements are Fluid and Coordinated --  improved diaphragmatic breathing    Fine Motor Movements are Fluid and Coordinated --  excessive effort with pelvic tilt    Palpation   Palpation comment decreased restrictions along distal portion of abdominal scar                  Pelvic Floor Special Questions - 09/07/15 1749    Diastasis Recti 1 finger            [facility] Adult PT Treatment/Exercise - 09/07/15 1750    Neuro Re-ed    Neuro Re-ed Details  see pt instructions   Manual Therapy   Manual therapy comments abdominal massage, manual lymph drainage (abdomen LE)_, scar massage                PT Education - 09/07/15 1731    Education provided Yes   Education Details HEP    Person(s) Educated Patient   Methods Explanation;Demonstration;Tactile cues;Verbal cues;Handout   Comprehension Returned demonstration;Verbalized understanding             PT Long Term Goals - 09/07/15 1754    PT LONG TERM GOAL #1   Title Pt will report Type 4-5 bowel movements 75% of the time with 50%  decreased laxative use across 1 week in order to restore healthy GI and bowel function.   Time 12   Period Weeks   Status  On-going   PT LONG TERM GOAL #2   Title Pt will be compliant with increasing her water intake and decaf drinks/ other acidic drinks to 1:1 ratio in order to increase hydration and decrease constipation.    Time 12   Period Weeks   Status On-going   PT LONG TERM GOAL #3   Title Pt will demo < 2 fingers width separation below sternum and above umbilicus in order to increase intraabdominal pressure for improve LBP and ability to withstand load with exercises.    Time 12   Period Weeks   Status Achieved   PT LONG TERM GOAL #4   Title Pt will decrease her LBP score from  26  %    to  < 20    % in order to return to walking 3 miles per day.   Period Weeks   Status On-going   PT LONG TERM GOAL #5   Title Pt will demo B hip PROM IR in hooklying in order to return to walking and squatting with exercises/ ADLs.   Time 12   Period Weeks    Status On-going   PT LONG TERM GOAL #6   Title Pt will decrease her COREFO score 24   % to   < 20   %  in order to return to regular bowel movements.   Time 12   Period Weeks   Status On-going               Plan - 09/07/15 1751    Clinical Impression Statement Pt showed decreased fascial restrictions over abdominal scar after manual Tx. Pt also showed decreased abdominal separation since last visit. Pt stated her LBP decreased after learning to find pelvic neutral position. Pt demo'd good carry over with proper sitting posture. Pt continues to require skilled PT. Pt was provided book and internet resources on nutrition per pt's request.     Rehab Potential Good   Clinical Impairments Affecting Rehab Potential multiple abdominal surgeries, chronicity of Sx   PT Frequency 1x / week   PT Duration 12 weeks   PT Treatment/Interventions ADLs/Self Care Home Management;Aquatic Therapy;Biofeedback;Electrical Stimulation;Cryotherapy;Gait training;Moist Heat;Stair training;Functional mobility training;Therapeutic activities;Therapeutic exercise;Balance training;Neuromuscular re-education;Manual techniques;Energy conservation;Dry needling;Scar mobilization;Patient/family education;Manual lymph drainage   Consulted and Agree with Plan of Care Patient      Patient will benefit from skilled therapeutic intervention in order to improve the following deficits and impairments:  Increased fascial restricitons, Improper body mechanics, Pain, Decreased scar mobility, Increased muscle spasms, Postural dysfunction, Decreased endurance, Decreased range of motion, Decreased strength, Impaired flexibility, Decreased mobility, Decreased coordination, Decreased activity tolerance  Visit Diagnosis: Unspecified lack of coordination  Muscle weakness (generalized)  Abnormal posture     Problem List Patient Active Problem List   Diagnosis Date Noted  . Chronic constipation 07/29/2015  . Dry eye syndrome  06/08/2015  . Acid reflux 06/08/2015  . Arthritis, degenerative 06/08/2015  . Allergic rhinitis, seasonal 06/08/2015  . Gastrointestinal bleeding, upper 06/08/2015  . IBS (irritable bowel syndrome) 06/08/2015  . BP (high blood pressure) 02/23/2015  . Hyperparathyroidism ([place]) 02/09/2015    Jerl Mina ,PT, DPT, E-RYT   09/07/2015, 5:58 PM  [place] MAIN [facility] SERVICES [address] [place], [place], [address] Phone: [phone]   Fax:  [phone]  Name: MEAGON DEGOLIER MRN: [id number] Date of Birth: 10-18-1960

## 2015-09-07 NOTE — Addendum Note (Signed)
Addended by: Jerl Mina on: 09/07/2015 08:42 AM   Modules accepted: Orders

## 2015-09-07 NOTE — Patient Instructions (Addendum)
Bed time routine:  1.level 1  2. Pre massage range of motion     2b,  Deep core level 2 (10 x 3 )    3. Belly massage    ____________  Incorporate another 10 x 3  OF DEEP core LEVEL 2  MORNING / AFTERNOON   (Total of 60 reps /day )  ____________  Proper sitting posture, finding pelvic neutral and natural lumbar lordosis by rocking forward/ back and then neutral

## 2015-09-10 ENCOUNTER — Encounter: Payer: BLUE CROSS/BLUE SHIELD | Admitting: Physical Therapy

## 2015-09-15 ENCOUNTER — Telehealth: Payer: Self-pay | Admitting: Family Medicine

## 2015-09-15 NOTE — Telephone Encounter (Signed)
Done

## 2015-09-15 NOTE — Telephone Encounter (Signed)
Called patient to advise her that on her med list we only have her on 2 blood pressure medications (metoprolol and losartan). Patient reports that she has also been taking Lisinopril. Advised patient that per Dr. Rosanna Randy she was to discontinue lisinopril and start losartan (noted on 07/20/15, I think you were in [place] that week). She had called the office saying that the lisinopril gave her a cough. Just FYI.

## 2015-09-15 NOTE — Telephone Encounter (Signed)
Pt called says she thinks she is taking 3 blood pressure meds.  She needs to go over with someone regards her meds.  She dont thin k she needs to be taking all three.  Her call back is [phone]  Thanks Con Memos

## 2015-09-15 NOTE — Telephone Encounter (Signed)
Great. Please add Lisinopril to allergy list for cough. Thanks.

## 2015-09-17 ENCOUNTER — Encounter: Payer: BLUE CROSS/BLUE SHIELD | Admitting: Physical Therapy

## 2015-09-23 ENCOUNTER — Ambulatory Visit: Payer: BLUE CROSS/BLUE SHIELD | Admitting: Physical Therapy

## 2015-09-23 DIAGNOSIS — M6281 Muscle weakness (generalized): Secondary | ICD-10-CM

## 2015-09-23 DIAGNOSIS — R279 Unspecified lack of coordination: Secondary | ICD-10-CM

## 2015-09-23 DIAGNOSIS — R293 Abnormal posture: Secondary | ICD-10-CM

## 2015-09-23 NOTE — Therapy (Signed)
[place] MAIN [facility] SERVICES [address] [place], [place], [address] Phone: [phone]   Fax:  [phone]  Physical Therapy Treatment  Patient Details  Name: Jessica Bradshaw MRN: [id number] Date of Birth: August 21, 1960 Referring Provider: Venia Minks  Encounter Date: 09/23/2015      PT End of Session - 09/23/15 1244    Visit Number 3   Number of Visits 12   Date for PT Re-Evaluation 11/23/15   PT Start Time 0810   PT Stop Time 0915   PT Time Calculation (min) 65 min   Activity Tolerance Patient tolerated treatment well;No increased pain   Behavior During Therapy [facility] for tasks assessed/performed      Past Medical History  Diagnosis Date  . Hypertension   . Allergy     Past Surgical History  Procedure Laterality Date  . Tonsillectomy    . Abdominal hysterectomy  2007  . Hiatal hernia repair  2001  . Cesarean section  1985  . Transoral incisionless fundoplication  [id number]    There were no vitals filed for this visit.      Subjective Assessment - 09/23/15 0817    Subjective Pt had been performing her abdominal massage. Pt reported her groin pain has increased gradually (L > R) since her last session from two weeks ago. The left sided pain radiates to the hip from the suprapubic area. This pain interrupts her sleep when L side lying. Pain is worst with opening her legs and after walking. Pt has continued to walk 3 mil daily but afterwards, pt tries to stretch to decrease pain and it hurts to stretch. Pt performs her yoga routine 25-30 min everyday but the pain does not occur during this activity.  Pt denied blood in her stool/ urine. Pt notices undigested food which has been consistent for past years.     Pertinent History Hx of fibroids, leading hysterectomy (abdominal). also TIF, and emergency C-section. SIBO 3x with antibiotics Tx in the past. 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  Pt injuried her neck (herniated disk) with lifting son.    Patient Stated Goals return to being able to active without pain (walking, treadmilling, gym classes)             [facility] PT Assessment - 09/23/15 1052    Floor to Stand   Comments proper alignment and stability in LE    Other:   Other/ Comments yoga poses with pronation and poor alignment in SIJ    Palpation   Spinal mobility hypomobility along T 7-12 SP    SI assessment  L PSIS more posterior (p! with hip ext in sidelying at end range and hip flexion at end range) (post-Tx: no p! with hip flexion and ext end range)     Palpation comment decreased abdominal scar restrictions (remaining restrictions located along distal portion)                      [facility] Adult PT Treatment/Exercise - 09/23/15 1241    Neuro Re-ed    Neuro Re-ed Details  alignment in floor to rise t/f and  yoga poses (warrior II, forward forward, half-kneeling   Manual Therapy   Manual therapy comments sacral mobs, PA mobs Grade II at L PSIS, rotational mobs into extension and hip flexion, PA mobs along SP on T7-12 GradeII-III  PT Education - 09/23/15 1244    Education provided Yes   Education Details HEP   Person(s) Educated Patient   Methods Explanation;Demonstration;Tactile cues;Verbal cues;Handout   Comprehension Returned demonstration;Verbalized understanding             PT Long Term Goals - 09/23/15 1247    PT LONG TERM GOAL #1   Title Pt will report Type 4-5 bowel movements 75% of the time with 50%  decreased laxative use across 1 week in order to restore healthy GI and bowel function.   Time 12   Period Weeks   Status On-going   PT LONG TERM GOAL #2   Title Pt will be compliant with increasing her water intake and decaf drinks/ other acidic drinks to 1:1 ratio in order to increase hydration and decrease constipation.    Time 12   Period Weeks   Status On-going   PT LONG TERM GOAL #3    Title Pt will demo < 2 fingers width separation below sternum and above umbilicus in order to increase intraabdominal pressure for improve LBP and ability to withstand load with exercises.    Time 12   Period Weeks   Status Achieved   PT LONG TERM GOAL #4   Title Pt will decrease her LBP score from  26  %    to  < 20    % in order to return to walking 3 miles per day.   Period Weeks   Status On-going   PT LONG TERM GOAL #5   Title Pt will demo B hip PROM IR in hooklying in order to return to walking and squatting with exercises/ ADLs.   Time 12   Period Weeks   Status On-going   Additional Long Term Goals   Additional Long Term Goals Yes   PT LONG TERM GOAL #6   Title Pt will decrease her COREFO score 24   % to   < 20   %  in order to return to regular bowel movements.   Time 12   Period Weeks   Status On-going   PT LONG TERM GOAL #7   Title Pt will demo proper alignment techniques with self-selected yoga poses in order to minimize injuries and SIJ malalignment.   Time 12   Period Weeks   Status New               Plan - 09/23/15 1054    Clinical Impression Statement Pt 's L groin/ hip pain decreased from 3/10 to 0/10 pain following manual Tx to correct pelvic obliquities. Suspect pt's poor alignment in specific yoga poses and floor to rise t/f  may be a contributing factor to her pelvic obliquities. Pt showed proper alignment following neuromuscular re-education. Plan to continue addressing abdominal scar restrictions and modifying her self-selected yoga routine at future sessions. Pt will continue to benefit from skilled PT.     Rehab Potential Good   Clinical Impairments Affecting Rehab Potential multiple abdominal surgeries, chronicity of Sx   PT Frequency 1x / week   PT Duration 12 weeks   PT Treatment/Interventions ADLs/Self Care Home Management;Aquatic Therapy;Biofeedback;Electrical Stimulation;Cryotherapy;Gait training;Moist Heat;Stair training;Functional mobility  training;Therapeutic activities;Therapeutic exercise;Balance training;Neuromuscular re-education;Manual techniques;Energy conservation;Dry needling;Scar mobilization;Patient/family education;Manual lymph drainage   Consulted and Agree with Plan of Care Patient      Patient will benefit from skilled therapeutic intervention in order to improve the following deficits and impairments:  Increased fascial restricitons, Improper body mechanics, Pain, Decreased scar mobility, Increased  muscle spasms, Postural dysfunction, Decreased endurance, Decreased range of motion, Decreased strength, Impaired flexibility, Decreased mobility, Decreased coordination, Decreased activity tolerance  Visit Diagnosis: Unspecified lack of coordination  Muscle weakness (generalized)  Abnormal posture     Problem List Patient Active Problem List   Diagnosis Date Noted  . Chronic constipation 07/29/2015  . Dry eye syndrome 06/08/2015  . Acid reflux 06/08/2015  . Arthritis, degenerative 06/08/2015  . Allergic rhinitis, seasonal 06/08/2015  . Gastrointestinal bleeding, upper 06/08/2015  . IBS (irritable bowel syndrome) 06/08/2015  . BP (high blood pressure) 02/23/2015  . Hyperparathyroidism ([place]) 02/09/2015    Jerl Mina ,PT, DPT, E-RYT  09/23/2015, 12:50 PM  [place] MAIN [facility] SERVICES [address] [place], [place], [address] Phone: [phone]   Fax:  [phone]  Name: Jessica Bradshaw MRN: [id number] Date of Birth: [birth year]

## 2015-09-23 NOTE — Patient Instructions (Signed)
To maintain thoracic extension:  1. Open Book (handout)  And also to help with diaphragm breathing   2. In yoga: forward fold ( knees slight bnent. Flour courners of the feet engaged, presspalm against shins as shins press against palms) to lengthen the spine  ---> to rise up, shift knees forward, scoop hips under, chest lifts, lengthen up (snake like movement instead of hinge up at low back)   3. Use blocks to bring floor to you in lunge positions    Yoga modifications and alignment awareness to maintain sacroiliac joint alignment   1.  Ski tracks   2. Warrior two:    50% weight in both legs includes lifting arches at the right length  Front knee above ankle

## 2015-09-28 ENCOUNTER — Ambulatory Visit: Payer: BLUE CROSS/BLUE SHIELD | Attending: Family Medicine | Admitting: Physical Therapy

## 2015-09-28 DIAGNOSIS — M25572 Pain in left ankle and joints of left foot: Secondary | ICD-10-CM | POA: Insufficient documentation

## 2015-09-28 DIAGNOSIS — M6281 Muscle weakness (generalized): Secondary | ICD-10-CM | POA: Diagnosis present

## 2015-09-28 DIAGNOSIS — R293 Abnormal posture: Secondary | ICD-10-CM | POA: Diagnosis present

## 2015-09-28 DIAGNOSIS — R279 Unspecified lack of coordination: Secondary | ICD-10-CM | POA: Diagnosis not present

## 2015-09-28 NOTE — Therapy (Signed)
[place] MAIN [facility] SERVICES [address] [place], [place], [address] Phone: [phone]   Fax:  [phone]  Physical Therapy Treatment/ Progress Note  Patient Details  Name: Jessica Bradshaw MRN: [id number] Date of Birth: 23-Mar-1961 Referring Provider: Venia Minks  Encounter Date: 09/28/2015      PT End of Session - 09/28/15 1429    Visit Number 4   Number of Visits 12   Date for PT Re-Evaluation 11/23/15   PT Start Time 5809   PT Stop Time 1213   PT Time Calculation (min) 68 min   Activity Tolerance Patient tolerated treatment well;No increased pain   Behavior During Therapy [facility] for tasks assessed/performed      Past Medical History  Diagnosis Date  . Hypertension   . Allergy     Past Surgical History  Procedure Laterality Date  . Tonsillectomy    . Abdominal hysterectomy  2007  . Hiatal hernia repair  2001  . Cesarean section  1985  . Transoral incisionless fundoplication  [id number]    There were no vitals filed for this visit.      Subjective Assessment - 09/28/15 1110    Subjective Pt reports her bowel movements are better formed in the beginning of the bowel movements. Breathing has helped alot. Pt reported her hip pain has been "annoying" at a 4/10 pain level which has progressively worsened and now is interrupting her sleep. Pain spans from L groin to iliac crest and PSIS. Pt reported the massage over her scars at last session made her back pain feel "better".  Pt reported her L medial ankle has been hurting for the past m onth with a "burning, throbbing pain" without injury which is worse after sitting for a long time and she can "hardly" step on it.    Pertinent History Hx of fibroids, leading hysterectomy (abdominal). also TIF, and emergency C-section. SIBO 3x with antibiotics Tx in the past. 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  Pt injuried her neck (herniated  disk) with lifting son.    Patient Stated Goals return to being able to active without pain (walking, treadmilling, gym classes)             [facility] PT Assessment - 09/28/15 1258    Palpation   Spinal mobility T10 laterally shifted, hypomobility and tenderness with PAIVM along T7-10 with increased fascial restrictions along iliocostalis, [place] joints with limited mobility   SI assessment  PSIS B minor tenderness on L , symmtery noted                      [facility] Adult PT Treatment/Exercise - 09/28/15 1259    Neuro Re-ed    Neuro Re-ed Details  see pt instructions    Manual Therapy   Manual therapy comments PA mobs/ AP mobs Grade II T5-10 and STM along T7-10 iliocostalis, MWM openbook L                 PT Education - 09/28/15 1429    Education provided Yes   Education Details HEP, nutritional resources (web links)    Person(s) Educated Patient   Methods Explanation;Demonstration;Tactile cues;Verbal cues;Handout   Comprehension Returned demonstration;Verbalized understanding             PT Long Term Goals - 09/28/15 1212    PT LONG TERM GOAL #1   Title Pt will report Type 4-5  bowel movements 75% of the time with 50%  decreased laxative use across 1 week in order to restore healthy GI and bowel function.   Time 12   Period Weeks   Status On-going   PT LONG TERM GOAL #2   Title Pt will be compliant with increasing her water intake and decaf drinks/ other acidic drinks to 1:1 ratio in order to increase hydration and decrease constipation.    Time 12   Period Weeks   Status Achieved   PT LONG TERM GOAL #3   Title Pt will demo < 2 fingers width separation below sternum and above umbilicus in order to increase intraabdominal pressure for improve LBP and ability to withstand load with exercises.    Time 12   Period Weeks   Status Achieved   PT LONG TERM GOAL #4   Title Pt will decrease her LBP  ODI score from  26  %    to  < 20    % in order to return to  walking 3 miles per day.   Period Weeks   Status On-going   PT LONG TERM GOAL #5   Title Pt will demo B hip PROM IR in hooklying in order to return to walking and squatting with exercises/ ADLs.   Time 12   Period Weeks   Status On-going   Additional Long Term Goals   Additional Long Term Goals Yes   PT LONG TERM GOAL #6   Title Pt will decrease her COREFO score 24   % to   < 20   %  in order to return to regular bowel movements.   Time 12   Period Weeks   Status On-going   PT LONG TERM GOAL #7   Title Pt will demo proper alignment techniques with self-selected yoga poses in order to minimize injuries and SIJ malalignment.   Time 12   Period Weeks   Status Partially Met   PT LONG TERM GOAL #8   Title Pt will report no L ankle pain upon rising from a chair after sitting for long periods (> 30 min) in order to perform ADLs and fitness routines.    Time 12   Period Weeks   Status New               Plan - 09/28/15 1430    Clinical Impression Statement Across 4 visits, pt has met 2/7 goals and is progressing well towards her remaining goals. She has shown significantly decreased diastasis recti, abdominal scar restrictions, pelvic obliquities, and less slumped posture. Pt reported  having better formed stools for the initial part of her bowel movements.  Today, pt  showed more anterior/posterior excursion of her diaphragm and more awareness to lengthen her pelvic floor following manual Tx and neuro-muscular Tx which will further help with her bowel movements. Pt reported less tightness and had no pain in her L groin, iliac crest, and PSIS area following manual Tx at her thoracic spine (suspect iliopsoas mm involvement). Plan to assess pelvic floor mm at next session. PT is adding L ankle pain as another diagnosis to treat on this re-cert and plans to evaluate/treat at next session. Suspect her L ankle may be related to her L sided pelvic girdle issues. Pt would benefit from PT to  address her deficits and remaining goals.      Rehab Potential Good   Clinical Impairments Affecting Rehab Potential multiple abdominal surgeries, chronicity of Sx   PT Frequency  1x / week   PT Duration 12 weeks   PT Treatment/Interventions ADLs/Self Care Home Management;Aquatic Therapy;Biofeedback;Electrical Stimulation;Cryotherapy;Gait training;Moist Heat;Stair training;Functional mobility training;Therapeutic activities;Therapeutic exercise;Balance training;Neuromuscular re-education;Manual techniques;Energy conservation;Dry needling;Scar mobilization;Patient/family education;Manual lymph drainage   Consulted and Agree with Plan of Care Patient      Patient will benefit from skilled therapeutic intervention in order to improve the following deficits and impairments:  Increased fascial restricitons, Improper body mechanics, Pain, Decreased scar mobility, Increased muscle spasms, Postural dysfunction, Decreased endurance, Decreased range of motion, Decreased strength, Impaired flexibility, Decreased mobility, Decreased coordination, Decreased activity tolerance  Visit Diagnosis: Unspecified lack of coordination  Muscle weakness (generalized)  Abnormal posture  Pain in left ankle and joints of left foot     Problem List Patient Active Problem List   Diagnosis Date Noted  . Chronic constipation 07/29/2015  . Dry eye syndrome 06/08/2015  . Acid reflux 06/08/2015  . Arthritis, degenerative 06/08/2015  . Allergic rhinitis, seasonal 06/08/2015  . Gastrointestinal bleeding, upper 06/08/2015  . IBS (irritable bowel syndrome) 06/08/2015  . BP (high blood pressure) 02/23/2015  . Hyperparathyroidism ([place]) 02/09/2015    Jerl Mina 09/28/2015, 2:54 PM  [place] MAIN [facility] SERVICES [address] [place], [place], [address] Phone: [phone]   Fax:  [phone]  Name: Jessica Bradshaw MRN: [id number] Date of Birth:  July 28, 1960

## 2015-09-28 NOTE — Patient Instructions (Addendum)
HOME PRACTICE FROM TODAY  1. Seated and standing pelvic neutral alignment and deep core engaged  (rocking on sitting bones) or sifting center of mass forward/back along longitudinal arch and spreading weight to pinky toe side across transverse arch  Stacking from feet, to pelvis and ribcage--> expanding rib breathing  2. A. Cat/ cow 10 x seated  2    B. Lion's breath 3 x reps (press thighs with forearm perpendicularly on thighs to bring shoulder blades back and down)  3. Toileting: Pelvic floor lengthening with diaphragmatic expansion  Avoid pushing, straining  _______________________________________________________ MINDFULNESS PRACTICE  Attached is the body scan recording which is helpful in retraining the brain on pain. Can do it daily 3 cycles and can help you a lot because you have had your pains for a quite some time. As you are showing great improvements structural in your spine and pelvis, it is important to exercise the brain as well. Consider all of this a thorough upgrade like getting a new software system on your computer!  Below are some research behind mindfulness and retraining the brain on pain.   Research on use of body scan on brain InstantResales.com.cy  Article on mindfulness and pain: PrintStats.tn  Below are research articles about pain science. The same part of the brain is responsible for anxiety as well as mindfulness! I like to look at it like the [place] poem "The road not taken".  Retraining the brain is exercise too.  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  The Breathing  Book https://drive.google.com/file/d/0B6TxqcyS3BU[email or web address]/view?usp=sharing _____________________________________________________________________________________________________________________________   Mindfulness practice can be applied with eating as well as you build a practice of chewing slower and tasting your foods to absorb nutrients better. There are mindful practices with eating on this link.  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 Their book and recordings are an excellent resource. The center also has two nutritionists there as well.    RoulettePays.com.br  __________________________________________________________________________ The Nutritionist info I showed you this morning.  Tawanna Sat is great and her center is another great resource!!!  Harrold Donath MS, RD, LDN  [facility] Director of Nutrition  [address] [place] [id number] [place], [place] Phone: [phone] Fax: [phone] [email or web address]

## 2015-10-01 ENCOUNTER — Ambulatory Visit: Payer: BLUE CROSS/BLUE SHIELD | Admitting: Physical Therapy

## 2015-10-01 DIAGNOSIS — M25572 Pain in left ankle and joints of left foot: Secondary | ICD-10-CM

## 2015-10-01 DIAGNOSIS — M6281 Muscle weakness (generalized): Secondary | ICD-10-CM

## 2015-10-01 DIAGNOSIS — R279 Unspecified lack of coordination: Secondary | ICD-10-CM | POA: Diagnosis not present

## 2015-10-01 DIAGNOSIS — R293 Abnormal posture: Secondary | ICD-10-CM

## 2015-10-01 NOTE — Therapy (Addendum)
[place] MAIN [facility] SERVICES [address] [place], [place], [address] Phone: [phone]   Fax:  [phone]  Physical Therapy Treatment  Patient Details  Name: Jessica Bradshaw MRN: [id number] Date of Birth: 03-02-1961 Referring Provider: Venia Minks  Encounter Date: 10/01/2015      PT End of Session - 10/01/15 2337    Visit Number 5   Number of Visits 12   Date for PT Re-Evaluation 11/23/15   PT Start Time 8366   PT Stop Time 2947   PT Time Calculation (min) 63 min   Activity Tolerance Patient tolerated treatment well;No increased pain   Behavior During Therapy [facility] for tasks assessed/performed      Past Medical History  Diagnosis Date  . Hypertension   . Allergy     Past Surgical History  Procedure Laterality Date  . Tonsillectomy    . Abdominal hysterectomy  2007  . Hiatal hernia repair  2001  . Cesarean section  1985  . Transoral incisionless fundoplication  6546    There were no vitals filed for this visit.      Subjective Assessment - 10/01/15 1554    Subjective Pt reported her L hip/groin and ankle pains have improved by 90%. Pt is concerned it may come by or be "creeping" back.    Pertinent History Hx of fibroids, leading hysterectomy (abdominal). also TIF, and emergency C-section. SIBO 3x with antibiotics Tx in the past. 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  Pt injuried her neck (herniated disk) with lifting son.    Patient Stated Goals return to being able to active without pain (walking, treadmilling, gym classes)             [facility] PT Assessment - 10/01/15 2326    Observation/Other Assessments   Observations upright posture, shirt line in seated posture conveyed possible thorax rotation posteriorly R   Coordination   Fine Motor Movements are Fluid and Coordinated --  multifidis delayed in sequence with gluts and hamstring   Posture/Postural  Control   Posture Comments resisted scaption/ opp hip ext with spinal perturbation    Strength   Overall Strength Comments B hip abd 4/5    Ambulation/Gait   Gait Comments pronation of B feet, thorax more posterior                   Pelvic Floor Special Questions - 10/01/15 1646    Diastasis Recti <1 finger    Pelvic Floor Internal Exam pt consented verbally with contraindications   Exam Type Vaginal   Sensation decreased on R posterior area compared to L    Palpation increased mm tensions without tenderness along iliococcygeus, ischiococcygeus R > L    Strength good squeeze, good lift, able to hold agaisnt strong resistance   Biofeedback delayed lengthening, mild abdominal straining noted, required tactile cues applied on self to elicit diaphragmatic excursion           [facility] Adult PT Treatment/Exercise - 10/01/15 2326    Neuro Re-ed    Neuro Re-ed Details  cued for proper alignment to new HEP, tactile cues for diaphragmatic excursion, body scan mindfulness technique 3 cycles   Exercises   Exercises --  see pt instructions   Manual Therapy   Internal Pelvic Floor thiele massage B pelvic floor mm all layers, and sustained pressure along R ilio/ischiococcygeus R   scar massage over perineal scar on  R                 PT Education - 10/01/15 2334    Education provided Yes   Education Details HEP   Person(s) Educated Patient   Methods Explanation;Demonstration;Tactile cues;Verbal cues;Handout   Comprehension Returned demonstration;Verbalized understanding             PT Long Term Goals - 09/28/15 1212    PT LONG TERM GOAL #1   Title Pt will report Type 4-5 bowel movements 75% of the time with 50%  decreased laxative use across 1 week in order to restore healthy GI and bowel function.   Time 12   Period Weeks   Status On-going   PT LONG TERM GOAL #2   Title Pt will be compliant with increasing her water intake and decaf drinks/ other acidic drinks  to 1:1 ratio in order to increase hydration and decrease constipation.    Time 12   Period Weeks   Status Achieved   PT LONG TERM GOAL #3   Title Pt will demo < 2 fingers width separation below sternum and above umbilicus in order to increase intraabdominal pressure for improve LBP and ability to withstand load with exercises.    Time 12   Period Weeks   Status Achieved   PT LONG TERM GOAL #4   Title Pt will decrease her LBP  ODI score from  26  %    to  < 20    % in order to return to walking 3 miles per day.   Period Weeks   Status On-going   PT LONG TERM GOAL #5   Title Pt will demo B hip PROM IR in hooklying in order to return to walking and squatting with exercises/ ADLs.   Time 12   Period Weeks   Status On-going   Additional Long Term Goals   Additional Long Term Goals Yes   PT LONG TERM GOAL #6   Title Pt will decrease her COREFO score 24   % to   < 20   %  in order to return to regular bowel movements.   Time 12   Period Weeks   Status On-going   PT LONG TERM GOAL #7   Title Pt will demo proper alignment techniques with self-selected yoga poses in order to minimize injuries and SIJ malalignment.   Time 12   Period Weeks   Status Partially Met   PT LONG TERM GOAL #8   Title Pt will report no L ankle pain upon rising from a chair after sitting for long periods (> 30 min) in order to perform ADLs and fitness routines.    Time 12   Period Weeks   Status New               Plan - 10/01/15 2338    Clinical Impression Statement Pt reported 90% improvement with there L groin/hip and ankle pain since last visit. Following body scan mindfulness practice, pt reported her ankle pain was gone. Pt voiced understanding to pain science education which was introduced to pt to minimize her anxiety about potential relapse of her pain. Pt demo'd increased sensation of R pelvic floor mm following internal manual Tx to release perineal scar and mm tensions. Pt required further  neuromuscular retraining  to elicit more diaphragmatic excurison in coordination with pelvic floor lengthening which pt was able to demo correctly at the end of the session. Progressed postural training in standing posture  today and hip strengthening .  Pt will continue to benefit from skilled PT.     Rehab Potential Good   Clinical Impairments Affecting Rehab Potential multiple abdominal surgeries, chronicity of Sx   PT Frequency 1x / week   PT Duration 12 weeks   PT Treatment/Interventions ADLs/Self Care Home Management;Aquatic Therapy;Biofeedback;Electrical Stimulation;Cryotherapy;Gait training;Moist Heat;Stair training;Functional mobility training;Therapeutic activities;Therapeutic exercise;Balance training;Neuromuscular re-education;Manual techniques;Energy conservation;Dry needling;Scar mobilization;Patient/family education;Manual lymph drainage   Consulted and Agree with Plan of Care Patient      Patient will benefit from skilled therapeutic intervention in order to improve the following deficits and impairments:  Increased fascial restricitons, Improper body mechanics, Pain, Decreased scar mobility, Increased muscle spasms, Postural dysfunction, Decreased endurance, Decreased range of motion, Decreased strength, Impaired flexibility, Decreased mobility, Decreased coordination, Decreased activity tolerance  Visit Diagnosis: Unspecified lack of coordination  Muscle weakness (generalized)  Abnormal posture  Pain in left ankle and joints of left foot     Problem List Patient Active Problem List   Diagnosis Date Noted  . Chronic constipation 07/29/2015  . Dry eye syndrome 06/08/2015  . Acid reflux 06/08/2015  . Arthritis, degenerative 06/08/2015  . Allergic rhinitis, seasonal 06/08/2015  . Gastrointestinal bleeding, upper 06/08/2015  . IBS (irritable bowel syndrome) 06/08/2015  . BP (high blood pressure) 02/23/2015  . Hyperparathyroidism ([place]) 02/09/2015    Jerl Mina  ,PT, DPT, E-RYT  10/01/2015, 11:46 PM  [place] MAIN [facility] SERVICES [address] [place], [place], [address] Phone: [phone]   Fax:  [phone]  Name: NICKOLE ADAMEK MRN: [id number] Date of Birth: Dec 05, 1960

## 2015-10-01 NOTE — Patient Instructions (Addendum)
Hip/ shoulder strengthening:   Modified side plank on forearm ( elbow slightly ahead of shoulder for stability)  5 reps x 2 on L, 5 reps on R   Clamshells on forearm  10 x 1 set  Clamshells w/ shoulders, head resting on side 10 x 2 set    _______  Balancing:  Think all four corners of feet Tree pose  L with heel against R calf, L ballmounds on floor   R with foot against calf (below knee , never on top of knee) , while standing leg pressed against foot   _______  Deep core level 1 with refined awareness of ribcage expanding laterally and pelvic floor lengthening on inhalation  _______  Standing posture: less on heels, more between ballmound and heels so ribcage can stack over pelvis

## 2015-10-07 ENCOUNTER — Encounter: Payer: BLUE CROSS/BLUE SHIELD | Admitting: Physical Therapy

## 2015-10-09 ENCOUNTER — Telehealth: Payer: Self-pay | Admitting: Family Medicine

## 2015-10-09 ENCOUNTER — Ambulatory Visit: Payer: BLUE CROSS/BLUE SHIELD | Admitting: Physical Therapy

## 2015-10-09 DIAGNOSIS — R279 Unspecified lack of coordination: Secondary | ICD-10-CM | POA: Diagnosis not present

## 2015-10-09 DIAGNOSIS — R293 Abnormal posture: Secondary | ICD-10-CM

## 2015-10-09 DIAGNOSIS — M6281 Muscle weakness (generalized): Secondary | ICD-10-CM

## 2015-10-09 DIAGNOSIS — M25572 Pain in left ankle and joints of left foot: Secondary | ICD-10-CM

## 2015-10-09 NOTE — Patient Instructions (Addendum)
Deep core level 3  30 x 2 x day   Discontinue side planks  Focus on clams  10 x 3 reps on L    Ankle eversion strengthening with yellow band  10 x 2 slight knee bend 10 x 1 straighter knee    Monster walking (in to out) left leg counterclockwise, right leg clockwise  10 ft x 4    ________________  Work stretch breaks   Semi tandem stance as another opton to stand with less load on the ankle joint   Heel raise 10 repx x 3 / workshift   _______________  Mobilization technique for the L ankle

## 2015-10-09 NOTE — Telephone Encounter (Signed)
Dr. Annamaria Boots just calling to give update on pt, she states pt has had regular bowel movements for 4 days now, no lower back pain, currently working with her ankle but not much pain with that. Dr young states overall pt is doing great. Dr Annamaria Boots states if you have any question for her to please feel free to call. Thanks CC

## 2015-10-09 NOTE — Therapy (Addendum)
[place] MAIN [facility] SERVICES [address] [place], [place], [address] Phone: [phone]   Fax:  [phone]  Physical Therapy Treatment  Patient Details  Name: Jessica Bradshaw MRN: [id number] Date of Birth: 1961-03-04 Referring Provider: Venia Minks  Encounter Date: 10/09/2015      PT End of Session - 10/09/15 1509    Visit Number 6   Number of Visits 12   Date for PT Re-Evaluation 11/23/15   PT Start Time 1402   PT Stop Time 1500   PT Time Calculation (min) 58 min   Activity Tolerance Patient tolerated treatment well;No increased pain   Behavior During Therapy [facility] for tasks assessed/performed      Past Medical History  Diagnosis Date  . Hypertension   . Allergy     Past Surgical History  Procedure Laterality Date  . Tonsillectomy    . Abdominal hysterectomy  2007  . Hiatal hernia repair  2001  . Cesarean section  1985  . Transoral incisionless fundoplication  3291    There were no vitals filed for this visit.      Subjective Assessment - 10/09/15 1410    Subjective (p) Pt has not as such time to do HEP due travels. Pt forgot to take Miralax for 4 days while traveling but she still had daily bowel movements.  Pt 's hip pain resolved to the point that she gets it on occasion and no longer everyday. It hurts with hip ER/ abduction in certain movements. Pt's L foot has increased 3/10 to 6/10 and has not decreased across this week.  It is worst with standing.    Pertinent History Hx of fibroids, leading hysterectomy (abdominal). also TIF, and emergency C-section. SIBO 3x with antibiotics Tx in the past. 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  Pt injuried her neck (herniated disk) with lifting son.    Patient Stated Goals return to being able to active without pain (walking, treadmilling, gym classes)             [facility] PT Assessment - 10/09/15 1419    AROM   Overall AROM Comments 52 cm from lateral tibial plateau to acromium ( L LE),  36 cm on RLE     Palpation   Palpation comment painand fascial restrcitions  at navivular joint L (decreased post-Tx)                      [facility] Adult PT Treatment/Exercise - 10/09/15 1505    Exercises   Exercises --  modified HEP, see pt instructions   Manual Therapy    fascial releases and Grade II mob at navicular/ calcaneus L                 PT Education - 10/09/15 1509    Education provided Yes   Education Details HEP   Person(s) Educated Patient   Methods Explanation;Demonstration;Tactile cues;Verbal cues;Handout   Comprehension Returned demonstration;Verbalized understanding             PT Long Term Goals - 10/09/15 1448    PT LONG TERM GOAL #1   Title Pt will report Type 4-5 bowel movements 75% of the time with 50%  decreased laxative use across 1 week in order to restore healthy GI and bowel function.   Time 12   Period Weeks   Status On-going   PT LONG TERM GOAL #2  Title Pt will be compliant with increasing her water intake and decaf drinks/ other acidic drinks to 1:1 ratio in order to increase hydration and decrease constipation.    Time 12   Period Weeks   Status Achieved   PT LONG TERM GOAL #3   Title Pt will demo < 2 fingers width separation below sternum and above umbilicus in order to increase intraabdominal pressure for improve LBP and ability to withstand load with exercises.    Time 12   Period Weeks   Status Achieved   PT LONG TERM GOAL #4   Title Pt will decrease her LBP  ODI score from  26  %    to  < 20    % in order to return to walking 3 miles per day.   Period Weeks   Status On-going   PT LONG TERM GOAL #5   Title Pt will demo B hip PROM IR in hooklying in order to return to walking and squatting with exercises/ ADLs.   Time 12   Period Weeks   Status On-going   Additional Long Term Goals   Additional Long Term Goals Yes   PT LONG TERM  GOAL #6   Title Pt will decrease her COREFO score 24   % to   < 20   %  in order to return to regular bowel movements.   Time 12   Period Weeks   Status On-going   PT LONG TERM GOAL #7   Title Pt will demo proper alignment techniques with self-selected yoga poses in order to minimize injuries and SIJ malalignment.   Time 12   Period Weeks   Status Partially Met   PT LONG TERM GOAL #8   Title Pt will report no L ankle pain upon rising from a chair after sitting for long periods (> 30 min) in order to perform ADLs and fitness routines.    Time 12   Period Weeks   Status New   PT LONG TERM GOAL  #9   TITLE Pt will achieve end range hip abd/ ER / flexion in supine on L without pain in order to return to functional activities.    Time 12   Period Weeks   Status New               Plan - 10/09/15 1454    Clinical Impression Statement Pt is improving with daily bowel movements without Miralax for 4 days. Pt 's hip pain resolved to the point that she gets it on occasion and no longer everyday. Pt reported a decrease in pain 6/10 to 1/10 in her L ankle following manual Tx. Suspect her ankle pain will improve as her hip strength increases. Modified HEP and withheld sideplank and SLS due to difficulty level and pain with SLS weight bearing exercise. Pt continues to benefit from skilled PT    Rehab Potential Good   Clinical Impairments Affecting Rehab Potential multiple abdominal surgeries, chronicity of Sx   PT Frequency 1x / week   PT Duration 12 weeks   PT Treatment/Interventions ADLs/Self Care Home Management;Aquatic Therapy;Biofeedback;Electrical Stimulation;Cryotherapy;Gait training;Moist Heat;Stair training;Functional mobility training;Therapeutic activities;Therapeutic exercise;Balance training;Neuromuscular re-education;Manual techniques;Energy conservation;Dry needling;Scar mobilization;Patient/family education;Manual lymph drainage   Consulted and Agree with Plan of Care Patient       Patient will benefit from skilled therapeutic intervention in order to improve the following deficits and impairments:  Increased fascial restricitons, Improper body mechanics, Pain, Decreased scar mobility, Increased muscle spasms, Postural dysfunction,  Decreased endurance, Decreased range of motion, Decreased strength, Impaired flexibility, Decreased mobility, Decreased coordination, Decreased activity tolerance  Visit Diagnosis: Unspecified lack of coordination  Muscle weakness (generalized)  Abnormal posture  Pain in left ankle and joints of left foot     Problem List Patient Active Problem List   Diagnosis Date Noted  . Chronic constipation 07/29/2015  . Dry eye syndrome 06/08/2015  . Acid reflux 06/08/2015  . Arthritis, degenerative 06/08/2015  . Allergic rhinitis, seasonal 06/08/2015  . Gastrointestinal bleeding, upper 06/08/2015  . IBS (irritable bowel syndrome) 06/08/2015  . BP (high blood pressure) 02/23/2015  . Hyperparathyroidism ([place]) 02/09/2015    Jerl Mina ,PT, DPT, E-RYT  10/09/2015, 3:25 PM  [place] MAIN [facility] SERVICES [address] [place], [place], [address] Phone: [phone]   Fax:  [phone]  Name: ZIAIRE BIESER MRN: [id number] Date of Birth: 1961-02-13

## 2015-10-19 ENCOUNTER — Ambulatory Visit: Payer: BLUE CROSS/BLUE SHIELD | Admitting: Physical Therapy

## 2015-10-19 DIAGNOSIS — R293 Abnormal posture: Secondary | ICD-10-CM

## 2015-10-19 DIAGNOSIS — R279 Unspecified lack of coordination: Secondary | ICD-10-CM | POA: Diagnosis not present

## 2015-10-19 DIAGNOSIS — M25572 Pain in left ankle and joints of left foot: Secondary | ICD-10-CM

## 2015-10-19 DIAGNOSIS — M6281 Muscle weakness (generalized): Secondary | ICD-10-CM

## 2015-10-20 NOTE — Patient Instructions (Addendum)
Reclined twist to R (pillows between knees/ and beneath R knee)  Palm along iliac crest while extending L leg back and forth to stretch along lower quadrant of abdomen as practiced in session  10 reps    Nutritional resources: books on food combination and emailed link about SIBO, probiotic selection, chewing food thoroughly, use of spices that and food selections   https://www.https://mathis.com/ DoggyTherapist.cz  WeatherTheme.com.cy  (Food and Healing) Vernell Morgans, MD Staying Healthy With the [facility]  Provided food diary to chart out sequence of intake of melons in comparison to other foods ( discussed how melons break down into sugars more quickly than other fruits, referred pt to combination book by Fredderick Erb, PhD)

## 2015-10-20 NOTE — Therapy (Signed)
[place] MAIN [facility] SERVICES [address] [place], [place], [address] Phone: [phone]   Fax:  [phone]  Physical Therapy Treatment  Patient Details  Name: Jessica Bradshaw MRN: [id number] Date of Birth: [birth year] Referring Provider: Venia Minks  Encounter Date: 10/19/2015      PT End of Session - 10/20/15 2309    Visit Number 7   Number of Visits 12   Date for PT Re-Evaluation 11/23/15   PT Start Time 1115   PT Stop Time 1225   PT Time Calculation (min) 70 min   Activity Tolerance Patient tolerated treatment well;No increased pain   Behavior During Therapy [facility] for tasks assessed/performed      Past Medical History  Diagnosis Date  . Hypertension   . Allergy     Past Surgical History  Procedure Laterality Date  . Tonsillectomy    . Abdominal hysterectomy  2007  . Hiatal hernia repair  2001  . Cesarean section  1985  . Transoral incisionless fundoplication  9169    There were no vitals filed for this visit.      Subjective Assessment - 10/20/15 2318    Subjective Pt reported 50% improvement with her L ankle/ foot pain and the intensity and duration is less because she applies the self-management techniques she learned at last session. Bowel movements continue to be daily but she also takes the Linsess and Miralex. Pt has been eating only one meal a day due to bloatedness which peaked at 10/10/15. Pt has been Dx with SIBO and has been following the SCD (Special Carbohyrdates Diet).  Pt has stopped taking Align brand of probiotics. Pt feels frustrated that she has not been able to get help for her digestive problems.  She feels she gets bloated anytime she eats.   Her favorite food is pasta because she is part [place] and when she eats it, she likes to eat alot of it with tomato sauce. She is upset that she can no longer eat this dish because of her issues.    Pertinent History Hx of fibroids, leading hysterectomy (abdominal).  also TIF, and emergency C-section. SIBO 3x with antibiotics Tx in the past. 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  Pt injuried her neck (herniated disk) with lifting son.    Patient Stated Goals return to being able to active without pain (walking, treadmilling, gym classes)             [facility] PT Assessment - 10/20/15 2245    Observation/Other Assessments   Observations pt showed PT a picture of distended belly (taken when her stomach bloating was at its peak)    Palpation   Palpation comment L foot: no increased tensions and tenderness over navicular joint, TrP along instrinsic feet muscles at medial aspect of plantar fascia   Pre-Tx: p! medial aspect of L iliac crest. Post-Tx: no p!                     [place] Adult PT Treatment/Exercise - 10/20/15 2249    Therapeutic Activites    Therapeutic Activities --  education,online/ book resources on nutrition, referral   Manual Therapy   Manual therapy comments L foot: STM along medial aspect of plantar fascia. L LQ of abdomen: distraction of LLE, manual traction at iliac crest in distal direction, stabilization hand on L GH joint with pt in supined reclined twist. 30 sec  x 5 , MWM with hip ext / knee ext as self mob technique    Internal Pelvic Floor --                PT Education - 10/20/15 2307    Education provided Yes   Education Details HEP, motivational interviewing, resources on nutrition (books/links and provided  referral to PT who is also a nutritional specialist) , provided empathy and encouragement    Person(s) Educated Patient   Methods Explanation;Demonstration;Tactile cues;Verbal cues;Handout  email    Comprehension Returned demonstration;Verbalized understanding             PT Long Term Goals - 10/09/15 1448    PT LONG TERM GOAL #1   Title Pt will report Type 4-5 bowel movements 75% of the time with 50%  decreased laxative use across 1  week in order to restore healthy GI and bowel function.   Time 12   Period Weeks   Status On-going   PT LONG TERM GOAL #2   Title Pt will be compliant with increasing her water intake and decaf drinks/ other acidic drinks to 1:1 ratio in order to increase hydration and decrease constipation.    Time 12   Period Weeks   Status Achieved   PT LONG TERM GOAL #3   Title Pt will demo < 2 fingers width separation below sternum and above umbilicus in order to increase intraabdominal pressure for improve LBP and ability to withstand load with exercises.    Time 12   Period Weeks   Status Achieved   PT LONG TERM GOAL #4   Title Pt will decrease her LBP  ODI score from  26  %    to  < 20    % in order to return to walking 3 miles per day.   Period Weeks   Status On-going   PT LONG TERM GOAL #5   Title Pt will demo B hip PROM IR in hooklying in order to return to walking and squatting with exercises/ ADLs.   Time 12   Period Weeks   Status On-going   Additional Long Term Goals   Additional Long Term Goals Yes   PT LONG TERM GOAL #6   Title Pt will decrease her COREFO score 24   % to   < 20   %  in order to return to regular bowel movements.   Time 12   Period Weeks   Status On-going   PT LONG TERM GOAL #7   Title Pt will demo proper alignment techniques with self-selected yoga poses in order to minimize injuries and SIJ malalignment.   Time 12   Period Weeks   Status Partially Met   PT LONG TERM GOAL #8   Title Pt will report no L ankle pain upon rising from a chair after sitting for long periods (> 30 min) in order to perform ADLs and fitness routines.    Time 12   Period Weeks   Status ongoing   PT LONG TERM GOAL  #9   TITLE Pt will achieve end range hip abd/ ER / flexion in supine on L without pain in order to return to functional activities.    Time 12   Period Weeks   Status New               Plan - 10/20/15 2309    Clinical Impression Statement Pt's L foot pain has  decreased by 50% and showed less  mm tensions/ TrP compared to last visit. Pt continued to benefit from manual Tx at plantar fascia (L foot) and at [facility] abdomen. Both areas were reported to be less tender with palpation post-Tx. Pt required biopsychosocial approaches to instill hope and encouragement re: her frustrate about her abdominal bloating, SIBO Sx. Pt was provided nutrional resources via email links and books with referrals to MDs, PhD, and PT who wrote nutritional information. Discussed pt would  benefit from a referral to a specialized nutritionist to assist with her digestive issues from a whole-person approach.  PT plans to do more research for healthcare providers and will pass info to pt.  Pt voiced feeling better with POC and education provided  re: her digestive problems. Pt will continue to benefit from skilled PT.     Rehab Potential Good   Clinical Impairments Affecting Rehab Potential multiple abdominal surgeries, chronicity of Sx   PT Frequency 1x / week   PT Duration 12 weeks   PT Treatment/Interventions ADLs/Self Care Home Management;Aquatic Therapy;Biofeedback;Electrical Stimulation;Cryotherapy;Gait training;Moist Heat;Stair training;Functional mobility training;Therapeutic activities;Therapeutic exercise;Balance training;Neuromuscular re-education;Manual techniques;Energy conservation;Dry needling;Scar mobilization;Patient/family education;Manual lymph drainage   Consulted and Agree with Plan of Care Patient      Patient will benefit from skilled therapeutic intervention in order to improve the following deficits and impairments:  Increased fascial restricitons, Improper body mechanics, Pain, Decreased scar mobility, Increased muscle spasms, Postural dysfunction, Decreased endurance, Decreased range of motion, Decreased strength, Impaired flexibility, Decreased mobility, Decreased coordination, Decreased activity tolerance  Visit Diagnosis: Unspecified lack of  coordination  Muscle weakness (generalized)  Abnormal posture  Pain in left ankle and joints of left foot     Problem List Patient Active Problem List   Diagnosis Date Noted  . Chronic constipation 07/29/2015  . Dry eye syndrome 06/08/2015  . Acid reflux 06/08/2015  . Arthritis, degenerative 06/08/2015  . Allergic rhinitis, seasonal 06/08/2015  . Gastrointestinal bleeding, upper 06/08/2015  . IBS (irritable bowel syndrome) 06/08/2015  . BP (high blood pressure) 02/23/2015  . Hyperparathyroidism ([place]) 02/09/2015    Jerl Mina ,PT, DPT, E-RYT  10/20/2015, 11:18 PM  [place] MAIN [facility] SERVICES [address] [place], [place], [address] Phone: [phone]   Fax:  [phone]  Name: Jessica Bradshaw MRN: [id number] Date of Birth: [birth year]

## 2015-10-28 ENCOUNTER — Ambulatory Visit: Payer: BLUE CROSS/BLUE SHIELD | Admitting: Physical Therapy

## 2015-10-30 ENCOUNTER — Encounter: Payer: BLUE CROSS/BLUE SHIELD | Admitting: Physical Therapy

## 2015-11-02 ENCOUNTER — Ambulatory Visit: Payer: BLUE CROSS/BLUE SHIELD | Attending: Family Medicine | Admitting: Physical Therapy

## 2015-11-02 DIAGNOSIS — R293 Abnormal posture: Secondary | ICD-10-CM | POA: Insufficient documentation

## 2015-11-02 DIAGNOSIS — M25572 Pain in left ankle and joints of left foot: Secondary | ICD-10-CM | POA: Insufficient documentation

## 2015-11-02 DIAGNOSIS — M6281 Muscle weakness (generalized): Secondary | ICD-10-CM

## 2015-11-02 DIAGNOSIS — R279 Unspecified lack of coordination: Secondary | ICD-10-CM | POA: Diagnosis not present

## 2015-11-03 NOTE — Patient Instructions (Signed)
Self massage over sternum   Yoga poses for rotating the ribcage: Extended side angle  Yoga pose for stretching anterior lower ribcage: Reverse warrior   __________________  Stairclimbing 3 mins with 3 min rest x 3 sets for cardio Hold on Pi Yo class

## 2015-11-03 NOTE — Therapy (Signed)
[place] MAIN [facility] SERVICES [address] [place], [place], [address] Phone: [phone]   Fax:  [phone]  Physical Therapy Treatment  Patient Details  Name: Jessica Bradshaw MRN: [id number] Date of Birth: [birth year] Referring Provider: Venia Minks  Encounter Date: 11/02/2015      PT End of Session - 11/03/15 1815    Visit Number 8   Number of Visits 12   Date for PT Re-Evaluation 11/23/15   PT Start Time 1005   PT Stop Time 1115   PT Time Calculation (min) 70 min   Activity Tolerance Patient tolerated treatment well;No increased pain   Behavior During Therapy [facility] for tasks assessed/performed      Past Medical History  Diagnosis Date  . Hypertension   . Allergy     Past Surgical History  Procedure Laterality Date  . Tonsillectomy    . Abdominal hysterectomy  2007  . Hiatal hernia repair  2001  . Cesarean section  1985  . Transoral incisionless fundoplication  4332    There were no vitals filed for this visit.      Subjective Assessment - 11/03/15 1824    Subjective Pt reported no foot pain and signficantly less pelvic pain for the past 2 weeks.  Three days ago, pt took a Pilates-Yoga class that included fast movements for cardio for a total of 35 min with warm and cool down.  Since then, pt experienced a relapse of pain 8/10 yesterday and today 4/10 in back and foot, and pelvic pain 2/10. Pt was tearful.      Pertinent History Hx of fibroids, leading hysterectomy (abdominal). also TIF, and emergency C-section. SIBO 3x with antibiotics Tx in the past. 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  Pt injuried her neck (herniated disk) with lifting son.    Patient Stated Goals return to being able to active without pain (walking, treadmilling, gym classes)             [facility] PT Assessment - 11/03/15 0945    Palpation   Palpation comment increased fascial restriction  along sternal angle, and anterior ribcage (decreased post Tx)    fascial restriction at low abdominal scar (decreased post Tx                     [facility] Adult PT Treatment/Exercise - 11/03/15 0956    Exercises   Exercises --  see pt instructions   Manual Therapy   Manual therapy comments abdominal scar releases, myofacial release over sternum, and costl angle                 PT Education - 11/03/15 1815    Education provided Yes   Education Details HEP, withhold Pi Yo class. Use HIIT training with stairclimbing for cardio. PT will contact nutritionist and refer pt to her   Person(s) Educated Patient   Methods Explanation;Demonstration;Tactile cues;Verbal cues;Handout   Comprehension Returned demonstration;Verbalized understanding             PT Long Term Goals - 10/09/15 1448    PT LONG TERM GOAL #1   Title Pt will report Type 4-5 bowel movements 75% of the time with 50%  decreased laxative use across 1 week in order to restore healthy GI and bowel function.   Time 12   Period Weeks   Status On-going   PT LONG TERM GOAL #2   Title  Pt will be compliant with increasing her water intake and decaf drinks/ other acidic drinks to 1:1 ratio in order to increase hydration and decrease constipation.    Time 12   Period Weeks   Status Achieved   PT LONG TERM GOAL #3   Title Pt will demo < 2 fingers width separation below sternum and above umbilicus in order to increase intraabdominal pressure for improve LBP and ability to withstand load with exercises.    Time 12   Period Weeks   Status Achieved   PT LONG TERM GOAL #4   Title Pt will decrease her LBP  ODI score from  26  %    to  < 20    % in order to return to walking 3 miles per day.   Period Weeks   Status On-going   PT LONG TERM GOAL #5   Title Pt will demo B hip PROM IR in hooklying in order to return to walking and squatting with exercises/ ADLs.   Time 12   Period Weeks   Status On-going    Additional Long Term Goals   Additional Long Term Goals Yes   PT LONG TERM GOAL #6   Title Pt will decrease her COREFO score 24   % to   < 20   %  in order to return to regular bowel movements.   Time 12   Period Weeks   Status On-going   PT LONG TERM GOAL #7   Title Pt will demo proper alignment techniques with self-selected yoga poses in order to minimize injuries and SIJ malalignment.   Time 12   Period Weeks   Status Partially Met   PT LONG TERM GOAL #8   Title Pt will report no L ankle pain upon rising from a chair after sitting for long periods (> 30 min) in order to perform ADLs and fitness routines.    Time 12   Period Weeks   Status New   PT LONG TERM GOAL  #9   TITLE Pt will achieve end range hip abd/ ER / flexion in supine on L without pain in order to return to functional activities.    Time 12   Period Weeks   Status New               Plan - 11/03/15 1816    Clinical Impression Statement Pt's relapse of pain was decreased with Tx. Pt was educated to withhold PiYo class because it may have contributed to her relapse of back pain. Recommended using HIIT with stairclimbing as a way to incorporate cardio into her workout.  PT will refer pt to nutritionist. Pt continues to require manual Tx to release upper abdomen. ribcage restrictions at next session.     Rehab Potential Good   Clinical Impairments Affecting Rehab Potential multiple abdominal surgeries, chronicity of Sx   PT Frequency 1x / week   PT Duration 12 weeks   PT Treatment/Interventions ADLs/Self Care Home Management;Aquatic Therapy;Biofeedback;Electrical Stimulation;Cryotherapy;Gait training;Moist Heat;Stair training;Functional mobility training;Therapeutic activities;Therapeutic exercise;Balance training;Neuromuscular re-education;Manual techniques;Energy conservation;Dry needling;Scar mobilization;Patient/family education;Manual lymph drainage   Consulted and Agree with Plan of Care Patient       Patient will benefit from skilled therapeutic intervention in order to improve the following deficits and impairments:  Increased fascial restricitons, Improper body mechanics, Pain, Decreased scar mobility, Increased muscle spasms, Postural dysfunction, Decreased endurance, Decreased range of motion, Decreased strength, Impaired flexibility, Decreased mobility, Decreased coordination, Decreased activity tolerance  Visit Diagnosis:  Unspecified lack of coordination  Muscle weakness (generalized)  Abnormal posture  Pain in left ankle and joints of left foot     Problem List Patient Active Problem List   Diagnosis Date Noted  . Chronic constipation 07/29/2015  . Dry eye syndrome 06/08/2015  . Acid reflux 06/08/2015  . Arthritis, degenerative 06/08/2015  . Allergic rhinitis, seasonal 06/08/2015  . Gastrointestinal bleeding, upper 06/08/2015  . IBS (irritable bowel syndrome) 06/08/2015  . BP (high blood pressure) 02/23/2015  . Hyperparathyroidism ([place]) 02/09/2015    Jerl Mina ,PT, DPT, E-RYT  11/03/2015, 6:24 PM  [place] MAIN [facility] SERVICES [address] [place], [place], [address] Phone: [phone]   Fax:  [phone]  Name: Jessica Bradshaw MRN: [id number] Date of Birth: 1961-01-09

## 2015-11-09 ENCOUNTER — Ambulatory Visit: Payer: BLUE CROSS/BLUE SHIELD | Admitting: Physical Therapy

## 2015-11-09 DIAGNOSIS — R279 Unspecified lack of coordination: Secondary | ICD-10-CM | POA: Diagnosis not present

## 2015-11-09 DIAGNOSIS — M25572 Pain in left ankle and joints of left foot: Secondary | ICD-10-CM

## 2015-11-09 DIAGNOSIS — M6281 Muscle weakness (generalized): Secondary | ICD-10-CM

## 2015-11-09 DIAGNOSIS — R293 Abnormal posture: Secondary | ICD-10-CM

## 2015-11-09 NOTE — Patient Instructions (Addendum)
Using pools noodles to strengthening:  Under thighs : sitting __> play toss with grand kids, sidestepping , walk backwards  Under feet:  pogostick hop with both feet and or single leg   Tug a war with grandkids to use   Sidestep with arms back holding noodle with thumbs out, shoulder extension 20 deg away from buttocks, then do the same with arms in mini squat   ________  Relaxation:  Noodles under neck, armpit, midback, and thighs: being dragged gentle in a "S" pattern along length of pool . If you are pulling: be on your knees for the depth of your pool and and tuck toes under when "walking" backwards as you pull  ________  Continue with  Progress with deep core level 4, R/ L When sliding L side, gentle pressure on inside iliac crest to create a stretch  30 x reps on each side   Happy baby or knees to chest and then slide heels out  5 breaths x 3 reps   Resistance bands exercises

## 2015-11-09 NOTE — Therapy (Signed)
[place] MAIN [facility] SERVICES [address] [place], [place], [address] Phone: [phone]   Fax:  [phone]  Physical Therapy Treatment / Progress Report    Patient Details  Name: Jessica Bradshaw MRN: [id number] Date of Birth: March 05, 1961 Referring Provider: Venia Minks  Encounter Date: 11/09/2015      PT End of Session - 11/09/15 1242    Visit Number 9   Number of Visits 12   Date for PT Re-Evaluation 11/23/15   PT Start Time 1005   PT Stop Time 1100   PT Time Calculation (min) 55 min   Activity Tolerance Patient tolerated treatment well;No increased pain   Behavior During Therapy [facility] for tasks assessed/performed      Past Medical History  Diagnosis Date  . Hypertension   . Allergy     Past Surgical History  Procedure Laterality Date  . Tonsillectomy    . Abdominal hysterectomy  2007  . Hiatal hernia repair  2001  . Cesarean section  1985  . Transoral incisionless fundoplication  2263    There were no vitals filed for this visit.      Subjective Assessment - 11/09/15 1222    Subjective Pt reported last week, she was able to have 2 bowel movements on 2 days. The second BM were small snake like shaped while the morning bowel movements tend to be loose. Pt reported she will f/u with her endocrinologist re: hyperparathyroid soon. And she plans to contact the nutritionist.  Her hip, foot, and back sometimes hurt but is no longer constant.   Pt wakes up in pain, but it dissipates once she moves around. Stairclimbing exercises are not a problem.              [facility] PT Assessment - 11/09/15 1036    AROM   Overall AROM Comments 36 cm from lat plateau to acromium in hip ER/ flexion  L LE  tingling sensaiton at L PSIS dissipated with pelvic tilts   Palpation   Palpation comment increased fascial restrictions in L inguinal and supra pubic area                      [facility] Adult PT Treatment/Exercise - 11/09/15  1226    Therapeutic Activites    Therapeutic Activities --  see pt instructions   Manual Therapy   Manual therapy comments abdominal scar releases, myofacial release over L supra pubic area and inguinal area    MWM heel slides and supine twist                 PT Education - 11/09/15 1221    Education provided Yes   Education Details HEP, referral to an acupuncturist/ nutritionist specialist    Person(s) Educated Patient   Methods Explanation;Demonstration;Tactile cues;Verbal cues;Handout   Comprehension Returned demonstration;Verbalized understanding             PT Long Term Goals - 11/09/15 1248    PT LONG TERM GOAL #1   Title Pt will report Type 4-5 bowel movements 75% of the time with 50%  decreased laxative use across 1 week in order to restore healthy GI and bowel function.   Time 12   Period Weeks   Status Partially Met   PT LONG TERM GOAL #2   Title Pt will be compliant with increasing her water intake and decaf drinks/ other acidic drinks to 1:1 ratio in order to increase hydration and decrease constipation.  Time 12   Period Weeks   Status Achieved   PT LONG TERM GOAL #3   Title Pt will demo < 2 fingers width separation below sternum and above umbilicus in order to increase intraabdominal pressure for improve LBP and ability to withstand load with exercises.    Time 12   Period Weeks   Status Achieved   PT LONG TERM GOAL #4   Title Pt will decrease her LBP  ODI score from  26  %    to  < 20    % in order to return to walking 3 miles per day.   Period Weeks   Status On-going   PT LONG TERM GOAL #5   Title Pt will demo B hip PROM IR in hooklying in order to return to walking and squatting with exercises/ ADLs.   Time 12   Period Weeks   Status Achieved   PT LONG TERM GOAL #6   Title Pt will decrease her COREFO score 24   % to   < 20   %  in order to return to regular bowel movements.   Time 12   Period Weeks   Status Partially Met   PT LONG TERM  GOAL #7   Title Pt will demo proper alignment techniques with self-selected yoga poses in order to minimize injuries and SIJ malalignment.   Time 12   Period Weeks   Status Achieved   PT LONG TERM GOAL #8   Title Pt will report no L ankle pain upon rising from a chair after sitting for long periods (> 30 min) in order to perform ADLs and fitness routines.    Time 12   Period Weeks   Status Partially Met   PT LONG TERM GOAL  #9   TITLE Pt will achieve end range hip abd/ ER / flexion in supine on L without pain in order to return to functional activities.    Time 12   Period Weeks   Status Partially Met   PT LONG TERM GOAL  #10   TITLE Pt will demo proper lifting mechanics and be able to perform 10 reps with 15# weight in order to lift her grandchildren with adequate strength   Time 12   Period Weeks   Status New               Plan - 11/09/15 1242    Clinical Impression Statement Across the past 9 visits, pt has achieved 4/10 goals and is progressing well towards her remaining goals. Pt's pains in her hips, abdomen, pelvis, and L  foot have decreased signifcantly and she is no longer experiencing it daily. Pt's sacro-iliac joint alignment, posture, and deep core mm strength and coordination have all improved. Pt's abdominal scar adhesions are decreasing. Pt regained full ROM with hip ER/ flexion in her L LE but experiences pain/ tightness in L inguinal area where increased fascial restrictions are evident. Pt reported 30% decrease in pain after manual Tx. Pt was educated on strengthening exercises to perform with her grandchild this week while they are in town in order to maintain a strengthening routine. Pt will require continue PT to progress to lifting exercises to lift her grandchildren.  Pt also reported the modification of an aerobic activity with stair climbing is not causing pain at all and pt understands not to attend [employer] class in the meantime. Pt continues to require PT to  modify her work-out routine to minimize relapse of  pain. Pt has been referred to a nutritionist and also an acupuncturist for additional support to issues related to her digestive issues. Pt plans to f/u with her endocrinoloigst re: her hyperparathyroidism.    Rehab Potential Good   Clinical Impairments Affecting Rehab Potential multiple abdominal surgeries, chronicity of Sx   PT Frequency 1x / week   PT Duration 12 weeks   PT Treatment/Interventions ADLs/Self Care Home Management;Aquatic Therapy;Biofeedback;Electrical Stimulation;Cryotherapy;Gait training;Moist Heat;Stair training;Functional mobility training;Therapeutic activities;Therapeutic exercise;Balance training;Neuromuscular re-education;Manual techniques;Energy conservation;Dry needling;Scar mobilization;Patient/family education;Manual lymph drainage   Consulted and Agree with Plan of Care Patient      Patient will benefit from skilled therapeutic intervention in order to improve the following deficits and impairments:  Increased fascial restricitons, Improper body mechanics, Pain, Decreased scar mobility, Increased muscle spasms, Postural dysfunction, Decreased endurance, Decreased range of motion, Decreased strength, Impaired flexibility, Decreased mobility, Decreased coordination, Decreased activity tolerance  Visit Diagnosis: Unspecified lack of coordination  Muscle weakness (generalized)  Pain in left ankle and joints of left foot  Abnormal posture     Problem List Patient Active Problem List   Diagnosis Date Noted  . Chronic constipation 07/29/2015  . Dry eye syndrome 06/08/2015  . Acid reflux 06/08/2015  . Arthritis, degenerative 06/08/2015  . Allergic rhinitis, seasonal 06/08/2015  . Gastrointestinal bleeding, upper 06/08/2015  . IBS (irritable bowel syndrome) 06/08/2015  . BP (high blood pressure) 02/23/2015  . Hyperparathyroidism ([place]) 02/09/2015    Jerl Mina  ,PT, DPT, E-RYT   11/09/2015, 12:52  PM  [place] MAIN [facility] SERVICES [address] [place], [place], [address] Phone: [phone]   Fax:  [phone]  Name: Jessica Bradshaw MRN: [id number] Date of Birth: January 25, 1961

## 2015-11-16 ENCOUNTER — Encounter: Payer: BLUE CROSS/BLUE SHIELD | Admitting: Physical Therapy

## 2015-11-23 ENCOUNTER — Ambulatory Visit: Payer: BLUE CROSS/BLUE SHIELD | Admitting: Physical Therapy

## 2015-11-23 DIAGNOSIS — R279 Unspecified lack of coordination: Secondary | ICD-10-CM | POA: Diagnosis not present

## 2015-11-23 DIAGNOSIS — M6281 Muscle weakness (generalized): Secondary | ICD-10-CM

## 2015-11-23 DIAGNOSIS — M25572 Pain in left ankle and joints of left foot: Secondary | ICD-10-CM

## 2015-11-23 DIAGNOSIS — R293 Abnormal posture: Secondary | ICD-10-CM

## 2015-11-23 NOTE — Therapy (Signed)
[place] MAIN [facility] SERVICES [address] [place], [place], [address] Phone: [phone]   Fax:  [phone]  Physical Therapy Treatment [facility] Note  Patient Details  Name: Jessica Bradshaw MRN: [id number] Date of Birth: 13-Aug-1960 Referring Provider: Venia Minks  Encounter Date: 11/23/2015      PT End of Session - 11/23/15 2302    Visit Number 10   Date for PT Re-Evaluation 12/15/15   PT Start Time 0810   PT Stop Time 0920   PT Time Calculation (min) 70 min   Activity Tolerance Patient tolerated treatment well;No increased pain   Behavior During Therapy WFL for tasks assessed/performed      Past Medical History:  Diagnosis Date  . Allergy   . Hypertension     Past Surgical History:  Procedure Laterality Date  . ABDOMINAL HYSTERECTOMY  2007  . CESAREAN SECTION  1985  . HIATAL HERNIA REPAIR  2001  . TONSILLECTOMY    . transoral incisionless fundoplication  7858    There were no vitals filed for this visit.      Subjective Assessment - 11/23/15 0811    Subjective Across the past two weeks, pt reported her L hip pain has resolved by 60% and her L foot pain occurs seldomly with a twinge. Both of these Sx occur when she has been on her feet for long periods of time. Pt reported she had one intense episode of pain where she was able to decrease with massage over her abdominal scar and did not have to resort to going to the ER. She stated she did not vomit like she typically does with these intense episodes.     Pertinent History Hx of fibroids, leading hysterectomy (abdominal). also TIF, and emergency C-section. SIBO 3x with antibiotics Tx in the past. 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  Pt injuried her neck (herniated disk) with lifting son.    Patient Stated Goals return to being able to active without pain (walking, treadmilling, gym classes)             [facility]  PT Assessment - 11/23/15 2256      Observation/Other Assessments   Observations seated posture slumped with toes turned in      Coordination   Gross Motor Movements are Fluid and Coordinated --  cued for pelvic floor coordination with diaphragm    Fine Motor Movements are Fluid and Coordinated --  guided breathing with endrange hip flex/ER elicited no pain     Posture/Postural Control   Posture Comments seated posture with cervical retraction ,  L quadratus lumborum delayed > R with shoulder adduction from abduction      AROM   Overall AROM Comments hip IR in supine 90-90 flex with tenderness at L iliac crest, L trunk rotation with R iliac crest tenderness, R trunk rotation with L iliac tenderness      Strength   Overall Strength Comments L hip abd 4+/5     Palpation   SI assessment  slight limited mobility/ tenderness along L PSIS    Palpation comment B obturator internus with tensions and tenderness,                       [facility] Adult PT Treatment/Exercise - 11/23/15 2253      Neuro Re-ed    Neuro Re-ed Details  3-part breathing technique to facilitate anterior/posterior excursion of  diaphragm, seated posture with vocalizations and tactile cues for diaphragmatic activation to decrease slumped seated posture, corrected hip IR of feet in seated posture , progressed clam shells with yellow band on L, guided breathing with pelvic floor lengethening during end range hip flexion/ER       Manual Therapy   Manual therapy comments faciliated ribcage releases over sternum, STM releases over B obturator internus                  PT Education - 11/23/15 2301    Education provided Yes   Education Details HEP   Person(s) Educated Patient   Methods Explanation;Demonstration;Tactile cues;Verbal cues   Comprehension Returned demonstration;Verbalized understanding;Verbal cues required;Tactile cues required             PT Long Term Goals - 11/23/15 0917      PT  LONG TERM GOAL #1   Title Pt will report Type 4-5 bowel movements 75% of the time with 50%  decreased laxative use across 1 week in order to restore healthy GI and bowel function.   Time 12   Period Weeks   Status Partially Met     PT LONG TERM GOAL #2   Title Pt will be compliant with increasing her water intake and decaf drinks/ other acidic drinks to 1:1 ratio in order to increase hydration and decrease constipation.    Time 12   Period Weeks   Status Achieved     PT LONG TERM GOAL #3   Title Pt will demo < 2 fingers width separation below sternum and above umbilicus in order to increase intraabdominal pressure for improve LBP and ability to withstand load with exercises.    Time 12   Period Weeks   Status Achieved     PT LONG TERM GOAL #4   Title Pt will decrease her LBP  ODI score from  26  %    to  < 20    % in order to return to walking 3 miles per day.   Period Weeks   Status On-going     PT LONG TERM GOAL #5   Title Pt will demo B hip PROM IR in hooklying in order to return to walking and squatting with exercises/ ADLs.   Time 12   Period Weeks   Status Achieved     PT LONG TERM GOAL #6   Title Pt will decrease her COREFO score 24   % to   < 20   %  in order to return to regular bowel movements.   Time 12   Period Weeks   Status Partially Met     PT LONG TERM GOAL #7   Title Pt will demo proper alignment techniques with self-selected yoga poses in order to minimize injuries and SIJ malalignment.   Time 12   Period Weeks   Status Achieved     PT LONG TERM GOAL #8   Title Pt will report no L ankle pain upon rising from a chair after sitting for long periods (> 30 min) in order to perform ADLs and fitness routines.    Time 12   Period Weeks   Status Achieved     PT LONG TERM GOAL  #9   TITLE Pt will achieve end range hip abd/ ER / flexion in supine on L without pain in order to return to functional activities.    Time 12   Period Weeks   Status Achieved  PT LONG TERM GOAL  #10   TITLE Pt will demo proper lifting mechanics and be able to perform 10 reps with 15# weight in order to lift her grandchildren with adequate strength   Time 12   Period Weeks   Status On-going               Plan - 11/23/15 2302    Clinical Impression Statement Pt has met 6/10 goals and is progressing well towards her remaining goals. Pt is showing excellent progress with self-management over the past two weeks as she reported being able to avoid going to the ER during a painful episode by using self-massage techniques. Pt also reports her L hip pain has improved by 60%, her foot pain has mostly resolved, and her bowel movements are better formed in consistency every morning.  Pt demo'd improved anterior and posterior excursion of the diaphragm to today after Tx which will faciliate improved upright  seated posture. Pt also demo'd decreased pelvic floor mm tensions and learned to decrease pain with end range hip flexion/ER/ abd with coordinated breathing.  Pt showed increased L hip abd strength to 4-/5 and was progressed to resistance band strengthening. Pt was tearful today. PT actively listened to pt with acknowledgement of her emotions and provided encouragement on pt's progress . Anticipate pt will continue to make progress towards her goals. Plan to address delayed L quadratus lumborum and progress to thoracolumbar strengthening at next visit.       Rehab Potential Good   Clinical Impairments Affecting Rehab Potential multiple abdominal surgeries, chronicity of Sx   PT Frequency 1x / week   PT Duration 12 weeks   PT Treatment/Interventions ADLs/Self Care Home Management;Aquatic Therapy;Biofeedback;Electrical Stimulation;Cryotherapy;Gait training;Moist Heat;Stair training;Functional mobility training;Therapeutic activities;Therapeutic exercise;Balance training;Neuromuscular re-education;Manual techniques;Energy conservation;Dry needling;Scar mobilization;Patient/family  education;Manual lymph drainage   Consulted and Agree with Plan of Care Patient      Patient will benefit from skilled therapeutic intervention in order to improve the following deficits and impairments:  Increased fascial restricitons, Improper body mechanics, Pain, Decreased scar mobility, Increased muscle spasms, Postural dysfunction, Decreased endurance, Decreased range of motion, Decreased strength, Impaired flexibility, Decreased mobility, Decreased coordination, Decreased activity tolerance  Visit Diagnosis: Unspecified lack of coordination  Muscle weakness (generalized)  Abnormal posture  Pain in left ankle and joints of left foot     Problem List Patient Active Problem List   Diagnosis Date Noted  . Chronic constipation 07/29/2015  . Dry eye syndrome 06/08/2015  . Acid reflux 06/08/2015  . Arthritis, degenerative 06/08/2015  . Allergic rhinitis, seasonal 06/08/2015  . Gastrointestinal bleeding, upper 06/08/2015  . IBS (irritable bowel syndrome) 06/08/2015  . BP (high blood pressure) 02/23/2015  . Hyperparathyroidism ([place]) 02/09/2015    Jerl Mina ,PT, DPT, E-RYT  11/23/2015, 11:21 PM  [place] MAIN [facility] SERVICES [address] [place], [place], [address] Phone: [phone]   Fax:  [phone]  Name: MURREL FREET MRN: [id number] Date of Birth: [birth year]

## 2015-11-23 NOTE — Patient Instructions (Signed)
Emailed vocalizations sounds   3-part breathing technique to expand diaphragm anterior/posterior directions without abdominal bulging   Clam shells with yellow band at thigh 10 reps L  Breathing to relax pelvic floor muscle when hips are hugged to chest to minimize pain

## 2015-11-30 ENCOUNTER — Ambulatory Visit: Payer: BLUE CROSS/BLUE SHIELD | Attending: Family Medicine | Admitting: Physical Therapy

## 2015-11-30 DIAGNOSIS — R279 Unspecified lack of coordination: Secondary | ICD-10-CM | POA: Diagnosis present

## 2015-11-30 DIAGNOSIS — M6281 Muscle weakness (generalized): Secondary | ICD-10-CM

## 2015-11-30 DIAGNOSIS — R293 Abnormal posture: Secondary | ICD-10-CM | POA: Insufficient documentation

## 2015-11-30 DIAGNOSIS — M25572 Pain in left ankle and joints of left foot: Secondary | ICD-10-CM | POA: Insufficient documentation

## 2015-12-01 NOTE — Therapy (Signed)
[place] MAIN [facility] SERVICES [address] [place], [place], [address] Phone: [phone]   Fax:  [phone]  Physical Therapy Treatment  Patient Details  Name: Jessica Bradshaw MRN: [id number] Date of Birth: 1960/12/18 Referring Provider: Venia Minks  Encounter Date: 11/30/2015      PT End of Session - 12/01/15 2230    Visit Number 11   Date for PT Re-Evaluation 12/15/15   PT Start Time 0810   PT Stop Time 0905   PT Time Calculation (min) 55 min   Activity Tolerance Patient tolerated treatment well;No increased pain   Behavior During Therapy WFL for tasks assessed/performed      Past Medical History:  Diagnosis Date  . Allergy   . Hypertension     Past Surgical History:  Procedure Laterality Date  . ABDOMINAL HYSTERECTOMY  2007  . CESAREAN SECTION  1985  . HIATAL HERNIA REPAIR  2001  . TONSILLECTOMY    . transoral incisionless fundoplication  5170    There were no vitals filed for this visit.      Subjective Assessment - 12/01/15 2206    Subjective Pt reported she has been feeling "good" and has been doing her exercises intermittenly (1x day) while family members are visiting her. Pt says her hip pain has not bothered her, only in a minor way with rolling in bed. Pt decreased her Miralax because her stools have been loose. Pt finds that she strains at times when she has to get to places in the morning.  Pt rarely eats breakfast. Pt feels her abdominal/ bathroom trips worsens with increased amounts of food digested.     Pertinent History Hx of fibroids, leading hysterectomy (abdominal). also TIF, and emergency C-section. SIBO 3x with antibiotics Tx in the past. 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  Pt injuried her neck (herniated disk) with lifting son.    Patient Stated Goals return to being able to active without pain (walking, treadmilling, gym classes)              [facility] PT Assessment - 12/01/15 2207      Coordination   Gross Motor Movements are Fluid and Coordinated --  minor cuing with segmental coordination, lumbopelvic rhythm   Fine Motor Movements are Fluid and Coordinated --  improved lateral excursion of diaphragm     Palpation   Palpation comment light manual assessemt: R subintercostal area limited > L (more equal post Tx)                        [facility] Adult PT Treatment/Exercise - 12/01/15 2211      Therapeutic Activites    Therapeutic Activities --  see pt instructions     Neuro Re-ed    Neuro Re-ed Details  see pt instructions     Manual Therapy   Manual therapy comments light manual therapy at sacrum, intercostals, occiput, upper quadrant on R abdomen                 PT Education - 12/01/15 2230    Education provided Yes   Education Details HEP   Person(s) Educated Patient   Methods Explanation;Demonstration;Tactile cues;Verbal cues   Comprehension Verbal cues required;Tactile cues required;Returned demonstration;Verbalized understanding             PT Long Term Goals - 11/23/15 0917      PT LONG TERM  GOAL #1   Title Pt will report Type 4-5 bowel movements 75% of the time with 50%  decreased laxative use across 1 week in order to restore healthy GI and bowel function.   Time 12   Period Weeks   Status Partially Met     PT LONG TERM GOAL #2   Title Pt will be compliant with increasing her water intake and decaf drinks/ other acidic drinks to 1:1 ratio in order to increase hydration and decrease constipation.    Time 12   Period Weeks   Status Achieved     PT LONG TERM GOAL #3   Title Pt will demo < 2 fingers width separation below sternum and above umbilicus in order to increase intraabdominal pressure for improve LBP and ability to withstand load with exercises.    Time 12   Period Weeks   Status Achieved     PT LONG TERM GOAL #4   Title Pt will decrease her LBP  ODI score from   26  %    to  < 20    % in order to return to walking 3 miles per day.   Period Weeks   Status On-going     PT LONG TERM GOAL #5   Title Pt will demo B hip PROM IR in hooklying in order to return to walking and squatting with exercises/ ADLs.   Time 12   Period Weeks   Status Achieved     PT LONG TERM GOAL #6   Title Pt will decrease her COREFO score 24   % to   < 20   %  in order to return to regular bowel movements.   Time 12   Period Weeks   Status Partially Met     PT LONG TERM GOAL #7   Title Pt will demo proper alignment techniques with self-selected yoga poses in order to minimize injuries and SIJ malalignment.   Time 12   Period Weeks   Status Achieved     PT LONG TERM GOAL #8   Title Pt will report no L ankle pain upon rising from a chair after sitting for long periods (> 30 min) in order to perform ADLs and fitness routines.    Time 12   Period Weeks   Status Achieved     PT LONG TERM GOAL  #9   TITLE Pt will achieve end range hip abd/ ER / flexion in supine on L without pain in order to return to functional activities.    Time 12   Period Weeks   Status Achieved     PT LONG TERM GOAL  #10   TITLE Pt will demo proper lifting mechanics and be able to perform 10 reps with 15# weight in order to lift her grandchildren with adequate strength   Time 12   Period Weeks   Status On-going               Plan - 12/01/15 2231    Clinical Impression Statement Pt's pain has decreased significantly. Currently, the focus is on her bowel/digestive goals. Pt demo'd more lateral excursion of the diaphragm.  Following Tx today, pt showed more symmetrical movement of her ribcage left and right. Pt required minor cuing for segmental motor control pattern with lumbopelvic /spinal joints. Continued to address pt's bowel issues with education for increasing fiber and morning routines to promote peristalsis. Pt is advancing towards her goals.    Rehab Potential Good  Clinical  Impairments Affecting Rehab Potential multiple abdominal surgeries, chronicity of Sx   PT Frequency 1x / week   PT Duration 12 weeks   PT Treatment/Interventions ADLs/Self Care Home Management;Aquatic Therapy;Biofeedback;Electrical Stimulation;Cryotherapy;Gait training;Moist Heat;Stair training;Functional mobility training;Therapeutic activities;Therapeutic exercise;Balance training;Neuromuscular re-education;Manual techniques;Energy conservation;Dry needling;Scar mobilization;Patient/family education;Manual lymph drainage   Consulted and Agree with Plan of Care Patient      Patient will benefit from skilled therapeutic intervention in order to improve the following deficits and impairments:  Increased fascial restricitons, Improper body mechanics, Pain, Decreased scar mobility, Increased muscle spasms, Postural dysfunction, Decreased endurance, Decreased range of motion, Decreased strength, Impaired flexibility, Decreased mobility, Decreased coordination, Decreased activity tolerance  Visit Diagnosis: Muscle weakness (generalized)  Abnormal posture  Pain in left ankle and joints of left foot  Unspecified lack of coordination     Problem List Patient Active Problem List   Diagnosis Date Noted  . Chronic constipation 07/29/2015  . Dry eye syndrome 06/08/2015  . Acid reflux 06/08/2015  . Arthritis, degenerative 06/08/2015  . Allergic rhinitis, seasonal 06/08/2015  . Gastrointestinal bleeding, upper 06/08/2015  . IBS (irritable bowel syndrome) 06/08/2015  . BP (high blood pressure) 02/23/2015  . Hyperparathyroidism ([place]) 02/09/2015    Jerl Mina ,PT, DPT, E-RYT  12/01/2015, 10:37 PM  [place] MAIN [facility] SERVICES [address] [place], [place], [address] Phone: [phone]   Fax:  [phone]  Name: KAYDEN AMEND MRN: [id number] Date of Birth: [birth year]

## 2015-12-01 NOTE — Patient Instructions (Signed)
Incorporate more fiber into breakfast: Stewed apples   Morning routine:  3 part breathing Seated cat / cow 3 reps Belly exercise for peristalsis

## 2015-12-02 ENCOUNTER — Other Ambulatory Visit: Payer: Self-pay | Admitting: Family Medicine

## 2015-12-02 DIAGNOSIS — I10 Essential (primary) hypertension: Secondary | ICD-10-CM

## 2015-12-03 NOTE — Telephone Encounter (Signed)
I can not tell who patient should follow up with-aa

## 2015-12-04 NOTE — Telephone Encounter (Signed)
Margarita Grizzle at [employer] called wanting to lknw which mg of Lisinopril she should take.  10 or 20.  I see in her allergies that she has allergy to Lisinopril?  Please advise pharmacy. [phone]  Thanks, Con Memos

## 2015-12-07 ENCOUNTER — Ambulatory Visit: Payer: BLUE CROSS/BLUE SHIELD | Admitting: Physical Therapy

## 2015-12-07 DIAGNOSIS — M6281 Muscle weakness (generalized): Secondary | ICD-10-CM | POA: Diagnosis not present

## 2015-12-07 DIAGNOSIS — M25572 Pain in left ankle and joints of left foot: Secondary | ICD-10-CM

## 2015-12-07 DIAGNOSIS — R279 Unspecified lack of coordination: Secondary | ICD-10-CM

## 2015-12-07 DIAGNOSIS — R293 Abnormal posture: Secondary | ICD-10-CM

## 2015-12-07 NOTE — Patient Instructions (Signed)
To release midback:  Yoga block lengthwise between shoulder blades.   Other under head.   Rock knees 5-10 deg each and allow midback muscles to release over edge of block.   10 min here    ___________  yellow band under heels while laying on back w/ knees bent  "W" exercise  10 reps x 2 sets  Hold band with thumbs point out - inhale and then exhale pull bands by bending elbows hands move in a "w"  (feel shoulder blades squeezing)    ___________   Sadie Haber pose 5 breaths  Each side

## 2015-12-07 NOTE — Therapy (Signed)
[place] MAIN [facility] SERVICES [address] [place], [place], [address] Phone: [phone]   Fax:  [phone]  Physical Therapy Treatment  Patient Details  Name: Jessica Bradshaw MRN: [id number] Date of Birth: 15-Jun-1960 Referring Provider: Venia Minks  Encounter Date: 12/07/2015    Past Medical History:  Diagnosis Date  . Allergy   . Hypertension     Past Surgical History:  Procedure Laterality Date  . ABDOMINAL HYSTERECTOMY  2007  . CESAREAN SECTION  1985  . HIATAL HERNIA REPAIR  2001  . TONSILLECTOMY    . transoral incisionless fundoplication  4332    There were no vitals filed for this visit.      Subjective Assessment - 12/08/15 2105    Subjective Pt has not used Miralax the past week and continues to have regular bowel movements. Pt also enjoyed a fine dining meal with her mother and did not endure any digestive issues    Pertinent History Hx of fibroids, leading hysterectomy (abdominal). also TIF, and emergency C-section. SIBO 3x with antibiotics Tx in the past. 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  Pt injuried her neck (herniated disk) with lifting son.    Patient Stated Goals return to being able to active without pain (walking, treadmilling, gym classes)             [facility] PT Assessment - 12/08/15 2106      Palpation   Palpation comment slight restrictions over abdomen and along thoracic segments T7-10 and interspinal mm L > R                     [facility] Adult PT Treatment/Exercise - 12/08/15 2107      Therapeutic Activites    Therapeutic Activities --  see pt instructions     Neuro Re-ed    Neuro Re-ed Details  --     Exercises   Exercises --  see pt instructions     Manual Therapy   Manual therapy comments light fascial releases over abdomen and thoracic mm in supine , Grade I -II AP mobs along T7-10                PT  Education - 12/08/15 2108    Education provided Yes   Education Details HEP   Person(s) Educated Patient   Methods Explanation;Demonstration;Tactile cues;Handout;Verbal cues   Comprehension Returned demonstration;Verbalized understanding;Verbal cues required;Tactile cues required             PT Long Term Goals - 11/23/15 0917      PT LONG TERM GOAL #1   Title Pt will report Type 4-5 bowel movements 75% of the time with 50%  decreased laxative use across 1 week in order to restore healthy GI and bowel function.   Time 12   Period Weeks   Status Partially Met     PT LONG TERM GOAL #2   Title Pt will be compliant with increasing her water intake and decaf drinks/ other acidic drinks to 1:1 ratio in order to increase hydration and decrease constipation.    Time 12   Period Weeks   Status Achieved     PT LONG TERM GOAL #3   Title Pt will demo < 2 fingers width separation below sternum and above umbilicus in order to increase intraabdominal pressure for improve LBP and ability to withstand load with exercises.    Time 12  Period Weeks   Status Achieved     PT LONG TERM GOAL #4   Title Pt will decrease her LBP  ODI score from  26  %    to  < 20    % in order to return to walking 3 miles per day.   Period Weeks   Status On-going     PT LONG TERM GOAL #5   Title Pt will demo B hip PROM IR in hooklying in order to return to walking and squatting with exercises/ ADLs.   Time 12   Period Weeks   Status Achieved     PT LONG TERM GOAL #6   Title Pt will decrease her COREFO score 24   % to   < 20   %  in order to return to regular bowel movements.   Time 12   Period Weeks   Status Partially Met     PT LONG TERM GOAL #7   Title Pt will demo proper alignment techniques with self-selected yoga poses in order to minimize injuries and SIJ malalignment.   Time 12   Period Weeks   Status Achieved     PT LONG TERM GOAL #8   Title Pt will report no L ankle pain upon rising from a  chair after sitting for long periods (> 30 min) in order to perform ADLs and fitness routines.    Time 12   Period Weeks   Status Achieved     PT LONG TERM GOAL  #9   TITLE Pt will achieve end range hip abd/ ER / flexion in supine on L without pain in order to return to functional activities.    Time 12   Period Weeks   Status Achieved     PT LONG TERM GOAL  #10   TITLE Pt will demo proper lifting mechanics and be able to perform 10 reps with 15# weight in order to lift her grandchildren with adequate strength   Time 12   Period Weeks   Status On-going               Plan - 12/08/15 2109    Clinical Impression Statement Pt is progressing well as she reports being able to have bowel movements without Miralax the past week and being able to enjoy a dinner at a restaurant without digestive issues. Pt showed decreased abdomen fascial restrictions today but required further fascial releases over abdomen along with manual Tx over thoracic segments. Pt continues to show more circumferential expansion of diaphragm along with decreased scar restrictions. Plan to continue to incorporate thoracolumbar strengthening exercises at next session. Pt is achieving her goals    Rehab Potential Good   Clinical Impairments Affecting Rehab Potential multiple abdominal surgeries, chronicity of Sx   PT Frequency 1x / week   PT Duration 12 weeks      Patient will benefit from skilled therapeutic intervention in order to improve the following deficits and impairments:  Increased fascial restricitons, Improper body mechanics, Pain, Decreased scar mobility, Increased muscle spasms, Postural dysfunction, Decreased endurance, Decreased range of motion, Decreased strength, Impaired flexibility, Decreased mobility, Decreased coordination, Decreased activity tolerance  Visit Diagnosis: Muscle weakness (generalized)  Abnormal posture  Pain in left ankle and joints of left foot  Unspecified lack of  coordination     Problem List Patient Active Problem List   Diagnosis Date Noted  . Chronic constipation 07/29/2015  . Dry eye syndrome 06/08/2015  . Acid reflux 06/08/2015  .  Arthritis, degenerative 06/08/2015  . Allergic rhinitis, seasonal 06/08/2015  . Gastrointestinal bleeding, upper 06/08/2015  . IBS (irritable bowel syndrome) 06/08/2015  . BP (high blood pressure) 02/23/2015  . Hyperparathyroidism ([place]) 02/09/2015    Jerl Mina ,PT, DPT, E-RYT  12/08/2015, 9:13 PM  [place] MAIN [facility] SERVICES [address] [place], [place], [address] Phone: [phone]   Fax:  [phone]  Name: LATARSHIA JERSEY MRN: [id number] Date of Birth: 1960/09/18

## 2015-12-16 ENCOUNTER — Ambulatory Visit: Payer: BLUE CROSS/BLUE SHIELD | Admitting: Physical Therapy

## 2016-01-08 ENCOUNTER — Other Ambulatory Visit: Payer: Self-pay | Admitting: Family Medicine

## 2016-01-08 ENCOUNTER — Ambulatory Visit: Payer: BLUE CROSS/BLUE SHIELD | Attending: Family Medicine | Admitting: Physical Therapy

## 2016-01-08 DIAGNOSIS — M25572 Pain in left ankle and joints of left foot: Secondary | ICD-10-CM | POA: Diagnosis present

## 2016-01-08 DIAGNOSIS — J302 Other seasonal allergic rhinitis: Secondary | ICD-10-CM

## 2016-01-08 DIAGNOSIS — M6281 Muscle weakness (generalized): Secondary | ICD-10-CM | POA: Diagnosis not present

## 2016-01-08 DIAGNOSIS — R279 Unspecified lack of coordination: Secondary | ICD-10-CM | POA: Diagnosis present

## 2016-01-08 DIAGNOSIS — I10 Essential (primary) hypertension: Secondary | ICD-10-CM

## 2016-01-08 DIAGNOSIS — R293 Abnormal posture: Secondary | ICD-10-CM

## 2016-01-08 NOTE — Telephone Encounter (Signed)
Not established with Dr Rometta Emery

## 2016-01-08 NOTE — Therapy (Signed)
[place] MAIN [facility] SERVICES [address] [place], [place], [address] Phone: [phone]   Fax:  [phone]  Physical Therapy Treatment / Progress Note  Patient Details  Name: Jessica Bradshaw MRN: [id number] Date of Birth: Jan 08, 1961 Referring Provider: Venia Minks  Encounter Date: 01/08/2016      PT End of Session - 01/08/16 0910    Visit Number 13   Date for PT Re-Evaluation 04/15/16   PT Start Time 0805   PT Stop Time 0910   PT Time Calculation (min) 65 min   Activity Tolerance Patient tolerated treatment well;No increased pain   Behavior During Therapy WFL for tasks assessed/performed      Past Medical History:  Diagnosis Date  . Allergy   . Hypertension     Past Surgical History:  Procedure Laterality Date  . ABDOMINAL HYSTERECTOMY  2007  . CESAREAN SECTION  1985  . HIATAL HERNIA REPAIR  2001  . TONSILLECTOMY    . transoral incisionless fundoplication  7824    There were no vitals filed for this visit.      Subjective Assessment - 01/08/16 0815    Subjective Pt returns to PT after a month due to family visits and other stressful events. Pt noticed her B groin wrapping to the back  has become tight. Pt notices even though she does not feel stressed but her body reacts.  Pt has been off of Miralax for one month but is still taking Linsezz which kicks in after 2 hrs-3 hrs from intake. This timing is difficult because she would like to have a bowel movement in the morning and begin her day of activities. Pt is still having dififculty eliminating completely. Pt has maintained regular walking 3 mi without pain nor difficulty except for last  night when she felt the groin pain again.  Pt still has 1 meal a day and still feels bloated after eating but it dissipates at a quicker rate with the intake of supplement called Arantil which she learned about from a SIBO summit seminar.  Pt will be seeing acupuncturist/ nutritionalist in  Oct.  Pt will have an edoscopy Oct 3 to see if ulcers have cleared up. Pt's chiopractor trerated her recent back.hip issue which relieved it to 75% and noticed that her back is moving better than prior visits from 1 months ago.               [facility] PT Assessment - 01/08/16 1958      Observation/Other Assessments   Observations with make-up on, bright affect, more formally dressed. upright posture without cuing     Posture/Postural Control   Posture Comments tightness reported with palpation at pubic symphysis with R SLR,  tightness referred to sacrum with palpation over pubic symphysis/tubercle      Palpation   Palpation comment fascial restrictions along distal scar, pubic symphysis, R lateral tubercle                      [facility] Adult PT Treatment/Exercise - 01/08/16 1956      Therapeutic Activites    Therapeutic Activities --  discussed progress, POC, fitness goals     Manual Therapy   Manual therapy comments fascial releases along R  lateral end of pubic tubercle, pubic symphysis, over distal edge of scar                       PT Long Term Goals -  01/08/16 2001      PT LONG TERM GOAL #1   Title Pt will report Type 4-5 bowel movements 75% of the time with 50%  decreased laxative use across 1 week in order to restore healthy GI and bowel function.   Time 12   Period Weeks   Status Achieved     PT LONG TERM GOAL #2   Title Pt will be compliant with increasing her water intake and decaf drinks/ other acidic drinks to 1:1 ratio in order to increase hydration and decrease constipation.    Time 12   Period Weeks   Status Achieved     PT LONG TERM GOAL #3   Title Pt will demo < 2 fingers width separation below sternum and above umbilicus in order to increase intraabdominal pressure for improve LBP and ability to withstand load with exercises.    Time 12   Period Weeks   Status Achieved     PT LONG TERM GOAL #4   Title Pt will decrease her LBP   ODI score from  26  %    to  < 20    % in order to return to walking 3 miles per day.   Period Weeks   Status Achieved     PT LONG TERM GOAL #5   Title Pt will demo B hip PROM IR in hooklying in order to return to walking and squatting with exercises/ ADLs.   Time 12   Period Weeks   Status Achieved     PT LONG TERM GOAL #6   Title Pt will decrease her COREFO score 24   % to   < 20   % and/or  report improved complete emptying 75% of the time  in order to return to regular bowel movements.   Time 12   Period Weeks   Status Partially Met     PT LONG TERM GOAL #7   Title Pt will demo proper alignment techniques with self-selected yoga poses in order to minimize injuries and SIJ malalignment.   Time 12   Period Weeks   Status Achieved     PT LONG TERM GOAL #8   Title Pt will report no L ankle pain upon rising from a chair after sitting for long periods (> 30 min) in order to perform ADLs and fitness routines.    Time 12   Period Weeks   Status Achieved     PT LONG TERM GOAL  #9   TITLE Pt will achieve end range hip abd/ ER / flexion in supine on L without pain in order to return to functional activities.    Time 12   Period Weeks   Status Achieved     PT LONG TERM GOAL  #10   TITLE Pt will demo proper lifting mechanics and be able to perform 10 reps with 15# weight in order to lift her grandchildren with adequate strength   Time 12   Period Weeks   Status On-going     PT LONG TERM GOAL  #11   TITLE Pt will demo proper alignment and modfications with fitness routines to be prepared for return to fitness (videos or community classes)    Time 12   Period Weeks   Status New               Plan - 01/08/16 2003    Clinical Impression Statement Pt has achieved 9/11 goals since [facility]. Pt has not used Miralax for  1 month. Pt shows resolved diastsis recti,  decreased abdominal scar adhesions, signficantly improved posture, and deep core coordination, and postural staibility.   Pt's LBP, ankle pain has also improved. Pt's remaining issue includes fascial restrictions over R pubic symphysis/tubercle and distal end of her scar which is likely associated with pt's c/o of tightness that occurred recently.  Pt continues to benefit from skilled PT to address this issue, perform rectal internal assessment to address her incomplete emptying issue, and to advance towards her remaining goals. Pt plans to work with an Engineer, production to make further progress related to her diet and digestion issues.     Rehab Potential Good   Clinical Impairments Affecting Rehab Potential multiple abdominal surgeries, chronicity of Sx   PT Frequency 1x / week   PT Duration 12 weeks      Patient will benefit from skilled therapeutic intervention in order to improve the following deficits and impairments:  Increased fascial restricitons, Improper body mechanics, Pain, Decreased scar mobility, Increased muscle spasms, Postural dysfunction, Decreased endurance, Decreased range of motion, Decreased strength, Impaired flexibility, Decreased mobility, Decreased coordination, Decreased activity tolerance  Visit Diagnosis: Muscle weakness (generalized)  Abnormal posture  Pain in left ankle and joints of left foot  Unspecified lack of coordination     Problem List Patient Active Problem List   Diagnosis Date Noted  . Chronic constipation 07/29/2015  . Dry eye syndrome 06/08/2015  . Acid reflux 06/08/2015  . Arthritis, degenerative 06/08/2015  . Allergic rhinitis, seasonal 06/08/2015  . Gastrointestinal bleeding, upper 06/08/2015  . IBS (irritable bowel syndrome) 06/08/2015  . BP (high blood pressure) 02/23/2015  . Hyperparathyroidism ([place]) 02/09/2015    Jerl Mina ,PT, DPT, E-RYT  01/08/2016, 8:08 PM  [place] MAIN [facility] SERVICES [address] [place], [place], [address] Phone: [phone]   Fax:  [phone]  Name: Jessica Bradshaw MRN: [id number] Date of Birth: 05/03/1960

## 2016-01-27 ENCOUNTER — Encounter: Payer: BLUE CROSS/BLUE SHIELD | Admitting: Physical Therapy

## 2016-02-10 ENCOUNTER — Ambulatory Visit: Payer: BLUE CROSS/BLUE SHIELD | Attending: Family Medicine | Admitting: Physical Therapy

## 2016-02-10 DIAGNOSIS — M25572 Pain in left ankle and joints of left foot: Secondary | ICD-10-CM | POA: Diagnosis present

## 2016-02-10 DIAGNOSIS — R279 Unspecified lack of coordination: Secondary | ICD-10-CM | POA: Diagnosis present

## 2016-02-10 DIAGNOSIS — R293 Abnormal posture: Secondary | ICD-10-CM | POA: Diagnosis present

## 2016-02-10 DIAGNOSIS — M6281 Muscle weakness (generalized): Secondary | ICD-10-CM | POA: Insufficient documentation

## 2016-02-11 NOTE — Therapy (Addendum)
[place] MAIN [facility] SERVICES [address] [place], [place], [address] Phone: [phone]   Fax:  [phone]  Physical Therapy Treatment  Patient Details  Name: Jessica Bradshaw MRN: [id number] Date of Birth: 25-Jun-1960 Referring Provider: Venia Minks  Encounter Date: 02/10/2016      PT End of Session - 02/10/16 0932    Visit Number 14   PT Start Time 0805   PT Stop Time 0905   PT Time Calculation (min) 60 min   Activity Tolerance Patient tolerated treatment well;No increased pain   Behavior During Therapy WFL for tasks assessed/performed      Past Medical History:  Diagnosis Date  . Allergy   . Hypertension     Past Surgical History:  Procedure Laterality Date  . ABDOMINAL HYSTERECTOMY  2007  . CESAREAN SECTION  1985  . HIATAL HERNIA REPAIR  2001  . TONSILLECTOMY    . transoral incisionless fundoplication  9798    There were no vitals filed for this visit.      Subjective Assessment - 02/10/16 0931    Subjective Improvements with musculoskeletal pain: Pt reports she continues to have no pelvic/low back/leg pain for two months. Pt has returned to walking 3 mi without pain.   Improvements with constipation:  Bowel movements were at a Bristol Stool scale Type 6-7 for the previous year on a daily basis with Linzess with a feeling of complete emptying. Recently in the past 1 week, stool consistency improved to Type 4 which occurs  1x/ day but the feeling of incomplete emptying still lingers afterwards despite having no bowels the second time going to the toilet.   Pt spoke with her GI doctor and was informed that inflammation has decreased in stomach and small intestine.  Her MD suspects her pain and nausea could be affected by the metal fastener that was placed for Transoral Incisionless Fundaplication procedure.  This procedure was performed because her valus esophagus to stomach was not functioning well. The surgeon also has  repaired a hiatal hernia. Both procedures required taking a larger piece of the stomach. Her MD advised her to continue with pelvic PT to transition from [place] to Miralax but pt feels nervous because the medication has worked for her and she does not want to go back to the constipation/ straining. Bloating continues to be under control. Appetite is present but pt continues to feel miserable after eating.   Addition updates related to co-morbidities: Pt had a parathyroid removed 02/05/16 with 3 remaining intact. Post-surgery, her MD informed her that the blood calcium levels returned normal because the hyperparathyroidism has been corrected which is suspected to help address ulcers.    Pt is scheduled to meet with an acupuncturist/nutritionist in Nov as recommended by PT.                  [facility] PT Assessment - 02/10/16 0932      Observation/Other Assessments   Observations heel slides caused pt to report nausea      Palpation   Spinal mobility slight hypomobility at thoracic, ribcage anterior/ posterior   limited excursion    Palpation comment increased scar restriction below sternum but less than SOC, also located medial to ASIS on R                      [facility] Adult PT Treatment/Exercise - 02/10/16 0932      Therapeutic Activites    Therapeutic Activities --  withheld deep core level 4 2/2 c/o nausea     Manual Therapy   Manual therapy comments abdominal scar massage to decrease scar restrictions  light manual Tx over sternum, intercostal mm, ribcage to faciliate diaphragmatic excursion                PT Education - 02/10/16 0932    Education provided Yes   Education Details HEP, education on cleansing practices to promote digestion: drinking room temperature water and first thing in the morning before drinking tea, reviewed previous HEP.   Person(s) Educated Patient   Methods Explanation;Demonstration;Tactile cues;Verbal cues;Handout   Comprehension  Verbalized understanding;Returned demonstration             PT Long Term Goals - 01/08/16 2001      PT LONG TERM GOAL #1   Title Pt will report Type 4-5 bowel movements 75% of the time with 50%  decreased laxative use across 1 week in order to restore healthy GI and bowel function.   Time 12   Period Weeks   Status Achieved     PT LONG TERM GOAL #2   Title Pt will be compliant with increasing her water intake and decaf drinks/ other acidic drinks to 1:1 ratio in order to increase hydration and decrease constipation.    Time 12   Period Weeks   Status Achieved     PT LONG TERM GOAL #3   Title Pt will demo < 2 fingers width separation below sternum and above umbilicus in order to increase intraabdominal pressure for improve LBP and ability to withstand load with exercises.    Time 12   Period Weeks   Status Achieved     PT LONG TERM GOAL #4   Title Pt will decrease her LBP  ODI score from  26  %    to  < 20    % in order to return to walking 3 miles per day.   Period Weeks   Status Achieved     PT LONG TERM GOAL #5   Title Pt will demo B hip PROM IR in hooklying in order to return to walking and squatting with exercises/ ADLs.   Time 12   Period Weeks   Status Achieved     PT LONG TERM GOAL #6   Title Pt will decrease her COREFO score 24   % to   < 20   % Pt will decrease her COREFO score 24   % to   < 20   % and/or  report improved complete emptying 75% of the time  in order to return to regular bowel movements  in order to return to regular bowel movements.   Time 12   Period Weeks   Status Partially Met     PT LONG TERM GOAL #7   Title Pt will demo proper alignment techniques with self-selected yoga poses in order to minimize injuries and SIJ malalignment.   Time 12   Period Weeks   Status Achieved     PT LONG TERM GOAL #8   Title Pt will report no L ankle pain upon rising from a chair after sitting for long periods (> 30 min) in order to perform ADLs and fitness  routines.    Time 12   Period Weeks   Status Achieved     PT LONG TERM GOAL  #9   TITLE Pt will achieve end range hip abd/ ER / flexion in supine on L without  pain in order to return to functional activities.    Time 12   Period Weeks   Status Achieved     PT LONG TERM GOAL  #10   TITLE Pt will demo proper lifting mechanics and be able to perform 10 reps with 15# weight in order to lift her grandchildren with adequate strength   Time 12   Period Weeks   Status On-going     PT LONG TERM GOAL  #11   TITLE Pt will demo proper alignment and modfications with fitness routines to be prepared for return to fitness (videos or community classes)    Time 12   Period Weeks   Status New                  Clinical Impression Statement Pt has met 9/11 goals since [facility] in May 2017. Pt's improvements include resolved pain in her back, hip, and leg for the past two months with return to walking 3 mi. Within the past week, pt started to have improved stool consistency (Bristol Scale Type 4) once a day compared to Type 1 stool type. Pt demonstrates significantly improved diastasis recti/ pelvic alignment, decreased abdominal scar restrictions/ pelvic floor muscle tensions, and improved mobility / coordination/ strength of deep core mm (diaphragm, abdomen, and pelvic floor muscles). Pt no longer strains with bowel movements and demo'd more upright posture. These improvements have facilitated improved intraabdominal pressure/ postural mm system and motility for bowel movements with report of decreased abdominal bloating.  Pt continues demonstrate increased fascial restrictions below sternum/ abdominal scar and c/o nausea and feeling miserable after eating. Pt will be seeing her GI doctor next week and will begin seeing an acupuncturist/nutritionist to gain an interdisciplinary approach to help with her digestive system. Pt will continue benefit skilled PT. Plan to perform intra-rectal exam to address  incomplete emptying issue and further address fascial restrictions below sternum. Plan to communicate with her other providers to access imaging reports and to update her improvements from a pelvic health physical therapy standpoint.      Rehab Potential Good   Clinical Impairments Affecting Rehab Potential multiple abdominal surgeries, chronicity of Sx   PT Frequency 1x / week   PT Duration 12 weeks      Patient will benefit from skilled therapeutic intervention in order to improve the following deficits and impairments:  Increased fascial restricitons, Improper body mechanics, Pain, Decreased  scar mobility, Increased muscle spasms, Postural dysfunction, Decreased endurance, Decreased range of motion, Decreased strength, Impaired flexibility, Decreased mobility, Decreased coordination, Decreased activity tolerance  Visit Diagnosis: Muscle weakness (generalized)  Abnormal posture  Pain in left ankle and joints of left foot  Unspecified lack of coordination     Problem List Patient Active Problem List   Diagnosis Date Noted  . Chronic constipation 07/29/2015  . Dry eye syndrome 06/08/2015  . Acid reflux 06/08/2015  . Arthritis, degenerative 06/08/2015  . Allergic rhinitis, seasonal 06/08/2015  . Gastrointestinal bleeding, upper 06/08/2015  . IBS (irritable bowel syndrome) 06/08/2015  . BP (high blood pressure) 02/23/2015  . Hyperparathyroidism ([place]) 02/09/2015    Jerl Mina ,PT, DPT, E-RYT  02/17/2016, 9:33 AM  [place] MAIN [facility] SERVICES [address] [place], [place], [address] Phone: [phone]   Fax:  [phone]  Name: JALEIYAH ALAS MRN: [id number] Date of Birth: [birth year]

## 2016-02-12 ENCOUNTER — Encounter: Payer: Self-pay | Admitting: Physician Assistant

## 2016-02-16 NOTE — Patient Instructions (Signed)
Continue with abdominal scar massage nightly 24min  Continue with deep core level 2 (knee out) 30 reps x 2 day   _________________  Morning: Drinking a cup of water (room temperature)  before tea  Review morning routine with lions breath to cleanse the mouth and belly rolling   __________________  Review from last session:  To release midback:  Yoga block lengthwise between shoulder blades.   Other under head.   Rock knees 5-10 deg each and allow midback muscles to release over edge of block.   10 min here    ___________  yellow band under heels while laying on back w/ knees bent  "W" exercise  10 reps x 2 sets  Hold band with thumbs point out - inhale and then exhale pull bands by bending elbows hands move in a "w"  (feel shoulder blades squeezing)    ___________   Sadie Haber pose 5 breaths  Each side  To release midback:  Yoga block lengthwise between shoulder blades.   Other under head.   Rock knees 5-10 deg each and allow midback muscles to release over edge of block.   10 min here    ___________  yellow band under heels while laying on back w/ knees bent  "W" exercise  10 reps x 2 sets  Hold band with thumbs point out - inhale and then exhale pull bands by bending elbows hands move in a "w"  (feel shoulder blades squeezing)    ___________   Sadie Haber pose 5 breaths  Each side

## 2016-03-09 ENCOUNTER — Ambulatory Visit: Payer: BLUE CROSS/BLUE SHIELD | Attending: Family Medicine | Admitting: Physical Therapy

## 2016-03-09 DIAGNOSIS — R293 Abnormal posture: Secondary | ICD-10-CM | POA: Insufficient documentation

## 2016-03-09 DIAGNOSIS — M25572 Pain in left ankle and joints of left foot: Secondary | ICD-10-CM | POA: Diagnosis present

## 2016-03-09 DIAGNOSIS — R279 Unspecified lack of coordination: Secondary | ICD-10-CM | POA: Diagnosis present

## 2016-03-09 DIAGNOSIS — M6281 Muscle weakness (generalized): Secondary | ICD-10-CM | POA: Diagnosis not present

## 2016-03-09 NOTE — Patient Instructions (Addendum)
Continue Deep core level 2 each night  Ribcage rotation exercises:    3 yoga poses (weight bearing for building bone, pelvic stability with thoracic rotation)   1. Warrior twist  arm on the same side as back leg will raise up, exhale, twist navel, chest, and then shoulders and head , resting arm onto opposite thigh 3 breaths , go ina  Graded way to only twist thorcic spine   2. parvokonasana twist      3. Eagle pose ___. Modified camel standing ( point elbows back to decrease strain on hips and to lift low ribs up)      4. Green band pulling lawn mower  Keep elbow by ribs, look as you move the hand and band, movement is in the ribcage and not the shoulder/ arm   10 reps    5.  Locust pose with fingers by hips, radiating "lasers to feeT", shoulder away from ears, inhale exhale, chest lifts slightly to feel mid back arch not in the back  5reps   6. Cat/ cat:  Isolate each each segment,  When dipping spine, start with untuck chin, shoulder down, squeeze blades together, then low back 5 reps    ___________________

## 2016-03-10 NOTE — Therapy (Signed)
[place] MAIN [facility] SERVICES [address] [place], [place], [address] Phone: [phone]   Fax:  [phone]  Physical Therapy Treatment  Patient Details  Name: Jessica Bradshaw MRN: [id number] Date of Birth: [birth year] Referring Provider: Venia Minks  Encounter Date: 03/09/2016      PT End of Session - 03/10/16 2341    Visit Number 15   Date for PT Re-Evaluation 04/01/16   PT Start Time 3762   PT Stop Time 1510   PT Time Calculation (min) 65 min   Activity Tolerance Patient tolerated treatment well;No increased pain   Behavior During Therapy WFL for tasks assessed/performed      Past Medical History:  Diagnosis Date  . Allergy   . Hypertension     Past Surgical History:  Procedure Laterality Date  . ABDOMINAL HYSTERECTOMY  2007  . CESAREAN SECTION  1985  . HIATAL HERNIA REPAIR  2001  . TONSILLECTOMY    . transoral incisionless fundoplication  8315    There were no vitals filed for this visit.      Subjective Assessment - 03/10/16 2352    Subjective Pt was feeling good in her stomach with no pain last week. Pt has seen an acupuncturist and her GI doctor. Pt has been put on antibiotics for the SIBO for 1 month which pt started 2 weeks ago. Today pt feels upper stomach pain and she was explained her GI doctor that there is a fastener from her previous surgery that could be causing her pain. Pt started feeling L/R hip pain and also R elbow pain.      Pertinent History Hx of fibroids, leading hysterectomy (abdominal). also TIF, and emergency C-section. SIBO 3x with antibiotics Tx in the past. 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  Pt injuried her neck (herniated disk) with lifting son.    Patient Stated Goals return to being able to active without pain (walking, treadmilling, gym classes)             [facility] PT Assessment - 03/10/16 2339      Observation/Other  Assessments   Observations increased postural strength, less slumped posture in standing, less cuing with propioception with standing yoga poses      Coordination   Gross Motor Movements are Fluid and Coordinated --  B multifidis delayed in relationship to hamstring, gluts     Other:   Other/ Comments low cobra and multifidis twist with red band caused "burning in midback" .  cues for disassociation between hips and ribcage  in standing yoga poses  helped to resolve hip complaints     ROM / Strength   AROM / PROM / Strength --  hip abd B 4+/5.      Palpation   Spinal mobility hypomobility at thoracic midback mm    SI assessment  no asymmetries   Palpation comment decreased scar restrictions                     [facility] Adult PT Treatment/Exercise - 03/10/16 2340      Therapeutic Activites    Therapeutic Activities --  see pt instructions     Neuro Re-ed    Neuro Re-ed Details  see pt instructions     Manual Therapy   Manual therapy comments Grade II AP mob thoracic segments  PT Education - 03/10/16 2341    Education provided Yes   Education Details HEP   Person(s) Educated Patient   Methods Explanation;Demonstration;Tactile cues;Verbal cues;Handout   Comprehension Returned demonstration;Verbalized understanding             PT Long Term Goals - 01/08/16 2001      PT LONG TERM GOAL #1   Title Pt will report Type 4-5 bowel movements 75% of the time with 50%  decreased laxative use across 1 week in order to restore healthy GI and bowel function.   Time 12   Period Weeks   Status Achieved     PT LONG TERM GOAL #2   Title Pt will be compliant with increasing her water intake and decaf drinks/ other acidic drinks to 1:1 ratio in order to increase hydration and decrease constipation.    Time 12   Period Weeks   Status Achieved     PT LONG TERM GOAL #3   Title Pt will demo < 2 fingers width separation below sternum and above  umbilicus in order to increase intraabdominal pressure for improve LBP and ability to withstand load with exercises.    Time 12   Period Weeks   Status Achieved     PT LONG TERM GOAL #4   Title Pt will decrease her LBP  ODI score from  26  %    to  < 20    % in order to return to walking 3 miles per day.   Period Weeks   Status Achieved     PT LONG TERM GOAL #5   Title Pt will demo B hip PROM IR in hooklying in order to return to walking and squatting with exercises/ ADLs.   Time 12   Period Weeks   Status Achieved     PT LONG TERM GOAL #6   Title Pt will decrease her COREFO score 24   % to   < 20   % Pt will decrease her COREFO score 24   % to   < 20   % and/or  report improved complete emptying 75% of the time  in order to return to regular bowel movements  in order to return to regular bowel movements.   Time 12   Period Weeks   Status Partially Met     PT LONG TERM GOAL #7   Title Pt will demo proper alignment techniques with self-selected yoga poses in order to minimize injuries and SIJ malalignment.   Time 12   Period Weeks   Status Achieved     PT LONG TERM GOAL #8   Title Pt will report no L ankle pain upon rising from a chair after sitting for long periods (> 30 min) in order to perform ADLs and fitness routines.    Time 12   Period Weeks   Status Achieved     PT LONG TERM GOAL  #9   TITLE Pt will achieve end range hip abd/ ER / flexion in supine on L without pain in order to return to functional activities.    Time 12   Period Weeks   Status Achieved     PT LONG TERM GOAL  #10   TITLE Pt will demo proper lifting mechanics and be able to perform 10 reps with 15# weight in order to lift her grandchildren with adequate strength   Time 12   Period Weeks   Status On-going     PT LONG TERM  GOAL  #11   TITLE Pt will demo proper alignment and modfications with fitness routines to be prepared for return to fitness (videos or community classes)    Time 12   Period  Weeks   Status New               Plan - 03/10/16 2342    Clinical Impression Statement Pt continues to show increased postural and hip strength, less slumped posture, and no asymmetries of pelvic girdle. Pt's upper abdominal pain decreased by 30% following today's manual Tx , neuromuscular re-edu , and therapeutic activities training that targeted dissociation of ribcage/pelvis,  increasing thoracic extension, and  sternal angle area stretching. Mini low cobra and multifidis twist with band were withheld due to pt's c/o burning in midback mm.  HEP was downgraded to focus on less on isometric strengthening and more on improving flexbility at thoracic area.  Pt continues to benefit from skilled PT.    Rehab Potential Good   Clinical Impairments Affecting Rehab Potential multiple abdominal surgeries, chronicity of Sx   PT Frequency 1x / week   PT Duration 12 weeks      Patient will benefit from skilled therapeutic intervention in order to improve the following deficits and impairments:  Increased fascial restricitons, Improper body mechanics, Pain, Decreased scar mobility, Increased muscle spasms, Postural dysfunction, Decreased endurance, Decreased range of motion, Decreased strength, Impaired flexibility, Decreased mobility, Decreased coordination, Decreased activity tolerance  Visit Diagnosis: No diagnosis found.     Problem List Patient Active Problem List   Diagnosis Date Noted  . Chronic constipation 07/29/2015  . Dry eye syndrome 06/08/2015  . Acid reflux 06/08/2015  . Arthritis, degenerative 06/08/2015  . Allergic rhinitis, seasonal 06/08/2015  . Gastrointestinal bleeding, upper 06/08/2015  . IBS (irritable bowel syndrome) 06/08/2015  . BP (high blood pressure) 02/23/2015  . Hyperparathyroidism ([place]) 02/09/2015    Jerl Mina ,PT, DPT, E-RYT  03/10/2016, 11:52 PM  [place] MAIN [facility] SERVICES [address]  [place], [place], [address] Phone: [phone]   Fax:  [phone]  Name: SIHAAM CHROBAK MRN: [id number] Date of Birth: [birth year]

## 2016-03-18 ENCOUNTER — Ambulatory Visit (INDEPENDENT_AMBULATORY_CARE_PROVIDER_SITE_OTHER): Payer: BLUE CROSS/BLUE SHIELD | Admitting: Family Medicine

## 2016-03-18 ENCOUNTER — Encounter: Payer: Self-pay | Admitting: Family Medicine

## 2016-03-18 VITALS — BP 122/90 | HR 84 | Temp 97.8°F | Resp 16 | Wt 140.0 lb

## 2016-03-18 DIAGNOSIS — B9789 Other viral agents as the cause of diseases classified elsewhere: Secondary | ICD-10-CM | POA: Diagnosis not present

## 2016-03-18 DIAGNOSIS — R1312 Dysphagia, oropharyngeal phase: Secondary | ICD-10-CM | POA: Diagnosis not present

## 2016-03-18 DIAGNOSIS — J069 Acute upper respiratory infection, unspecified: Secondary | ICD-10-CM

## 2016-03-18 MED ORDER — SUCRALFATE 1 G PO TABS: 1.0000 g | ORAL_TABLET | Freq: Three times a day (TID) | ORAL | 0 refills | Status: DC

## 2016-03-18 NOTE — Progress Notes (Signed)
Subjective:     Patient ID: Jessica Bradshaw, female   DOB: 12/08/1960, 55 y.o.   MRN: [id number]  HPI  Chief Complaint  Patient presents with  . Throat Problem    Pt c/o a globus sensation that has been occuring for 2 days. Is worsening; pt is now experiencing dysphagia. Pt also c/o vomiting induced by swallowing her medication tablets. Pt notes bilateral ear pain. Also admits she is stressed due to house guests,  Reports mild cold symptoms since 11/14 with residual cough particularly at night. States her neck is tender particularly at the front of her throat. Denies current reflux or heartburn. Hx of parathyroidism. States she has not needed or used Xanax at the present time.   Review of Systems     Objective:   Physical Exam  Constitutional: She appears well-developed and well-nourished. No distress.  Ears: T.M's intact without inflammation Sinuses: non-tender Throat: tonsils absent without posterior pharyngeal  erythema Neck: Anterior cervical tenderness with mildly enlarged lymph nodes. Midline neck mildly tender (patient states it hurts to swallow). No thyromegaly noted. Lungs: clear     Assessment:    1. Viral upper respiratory tract infection  2. Oropharyngeal dysphagia: ? Sore from cough? nocturnal reflux? Subacute thyroiditis - TSH - T4, free - sucralfate (CARAFATE) 1 g tablet; Take 1 tablet (1 g total) by mouth 4 (four) times daily -  with meals and at bedtime.  Dispense: 12 tablet; Refill: 0    Plan:    Discussed sx treatment of her URI. Further f/u pending lab work. Consider ENT referral if not improving.

## 2016-03-18 NOTE — Patient Instructions (Signed)
Discussed use of Mucinex D for congestion, Delsym for cough, and Benadryl for postnasal drainage 

## 2016-03-22 ENCOUNTER — Telehealth: Payer: Self-pay

## 2016-03-22 LAB — T4, FREE: FREE T4: 1.12 ng/dL (ref 0.82–1.77)

## 2016-03-22 LAB — TSH: TSH: 1.3 u[IU]/mL (ref 0.450–4.500)

## 2016-03-22 NOTE — Telephone Encounter (Signed)
LMTCB Emily Drozdowski, CMA  

## 2016-03-22 NOTE — Telephone Encounter (Signed)
-----   Message from Carmon Ginsberg, [place] sent at 03/22/2016  9:58 AM EST ----- Thyroid tests normal. If your throat is still sore would you like a referral to ENT?

## 2016-03-22 NOTE — Telephone Encounter (Signed)
Pt advised. Declines referral. Renaldo Fiddler, CMA

## 2016-03-22 NOTE — Telephone Encounter (Signed)
-----   Message from Carmon Ginsberg, [place] sent at 03/22/2016  3:07 PM EST ----- Please notify the patient per lab note

## 2016-03-22 NOTE — Telephone Encounter (Signed)
Pt called emily back    Her call back Is [phone]  Thanks, Con Memos

## 2016-03-22 NOTE — Telephone Encounter (Signed)
Patient states that she has been advised of report by a nurse already. KW

## 2016-04-06 ENCOUNTER — Ambulatory Visit: Payer: BLUE CROSS/BLUE SHIELD | Admitting: Physical Therapy

## 2016-04-12 ENCOUNTER — Emergency Department: Payer: BLUE CROSS/BLUE SHIELD

## 2016-04-12 ENCOUNTER — Encounter: Payer: Self-pay | Admitting: Physician Assistant

## 2016-04-12 ENCOUNTER — Emergency Department
Admission: EM | Admit: 2016-04-12 | Discharge: 2016-04-12 | Disposition: A | Discharge status: Home / Self Care | Payer: BLUE CROSS/BLUE SHIELD | Attending: Emergency Medicine | Admitting: Emergency Medicine

## 2016-04-12 ENCOUNTER — Encounter: Payer: Self-pay | Admitting: Emergency Medicine

## 2016-04-12 ENCOUNTER — Ambulatory Visit (INDEPENDENT_AMBULATORY_CARE_PROVIDER_SITE_OTHER): Payer: BLUE CROSS/BLUE SHIELD | Admitting: Physician Assistant

## 2016-04-12 VITALS — BP 134/82 | HR 88 | Temp 98.5°F | Resp 16

## 2016-04-12 DIAGNOSIS — R160 Hepatomegaly, not elsewhere classified: Secondary | ICD-10-CM

## 2016-04-12 DIAGNOSIS — K589 Irritable bowel syndrome without diarrhea: Secondary | ICD-10-CM | POA: Insufficient documentation

## 2016-04-12 DIAGNOSIS — I10 Essential (primary) hypertension: Secondary | ICD-10-CM | POA: Insufficient documentation

## 2016-04-12 DIAGNOSIS — E213 Hyperparathyroidism, unspecified: Secondary | ICD-10-CM | POA: Diagnosis not present

## 2016-04-12 DIAGNOSIS — Z79899 Other long term (current) drug therapy: Secondary | ICD-10-CM | POA: Insufficient documentation

## 2016-04-12 DIAGNOSIS — K5909 Other constipation: Secondary | ICD-10-CM

## 2016-04-12 DIAGNOSIS — R109 Unspecified abdominal pain: Secondary | ICD-10-CM | POA: Diagnosis present

## 2016-04-12 DIAGNOSIS — K581 Irritable bowel syndrome with constipation: Secondary | ICD-10-CM

## 2016-04-12 LAB — CBC
HEMATOCRIT: 43 % (ref 35.0–47.0)
Hemoglobin: 14 g/dL (ref 12.0–16.0)
MCH: 25.9 pg — ABNORMAL LOW (ref 26.0–34.0)
MCHC: 32.6 g/dL (ref 32.0–36.0)
MCV: 79.4 fL — AB (ref 80.0–100.0)
PLATELETS: 369 10*3/uL (ref 150–440)
RBC: 5.41 MIL/uL — ABNORMAL HIGH (ref 3.80–5.20)
RDW: 16.1 % — AB (ref 11.5–14.5)
WBC: 20.3 10*3/uL — AB (ref 3.6–11.0)

## 2016-04-12 LAB — COMPREHENSIVE METABOLIC PANEL
ALBUMIN: 4.6 g/dL (ref 3.5–5.0)
ALT: 15 U/L (ref 14–54)
AST: 26 U/L (ref 15–41)
Alkaline Phosphatase: 103 U/L (ref 38–126)
Anion gap: 12 (ref 5–15)
BILIRUBIN TOTAL: 0.6 mg/dL (ref 0.3–1.2)
BUN: 14 mg/dL (ref 6–20)
CHLORIDE: 97 mmol/L — AB (ref 101–111)
CO2: 25 mmol/L (ref 22–32)
Calcium: 9.7 mg/dL (ref 8.9–10.3)
Creatinine, Ser: 0.72 mg/dL (ref 0.44–1.00)
GFR calc Af Amer: >60 mL/min (ref >60)
GLUCOSE: 102 mg/dL — AB (ref 65–99)
POTASSIUM: 4.3 mmol/L (ref 3.5–5.1)
Sodium: 134 mmol/L — ABNORMAL LOW (ref 135–145)
TOTAL PROTEIN: 8.2 g/dL — AB (ref 6.5–8.1)

## 2016-04-12 LAB — URINALYSIS, COMPLETE (UACMP) WITH MICROSCOPIC
BACTERIA UA: NONE SEEN
Bilirubin Urine: NEGATIVE
GLUCOSE, UA: NEGATIVE mg/dL
Ketones, ur: 20 mg/dL — AB
Nitrite: NEGATIVE
PH: 9 — AB (ref 5.0–8.0)
Protein, ur: NEGATIVE mg/dL
SPECIFIC GRAVITY, URINE: 1.008 (ref 1.005–1.030)

## 2016-04-12 LAB — LIPASE, BLOOD: Lipase: 18 U/L (ref 11–51)

## 2016-04-12 MED ORDER — MORPHINE SULFATE (PF) 4 MG/ML IV SOLN
INTRAVENOUS | Status: AC
  Administered 2016-04-12: 4 mg via INTRAVENOUS
  Filled 2016-04-12: qty 1

## 2016-04-12 MED ORDER — MORPHINE SULFATE (PF) 4 MG/ML IV SOLN
4.0000 mg | Freq: Once | INTRAVENOUS | Status: AC
  Administered 2016-04-12: 4 mg via INTRAVENOUS

## 2016-04-12 MED ORDER — ONDANSETRON HCL 4 MG/2ML IJ SOLN
4.0000 mg | Freq: Once | INTRAMUSCULAR | Status: AC
  Administered 2016-04-12: 4 mg via INTRAVENOUS

## 2016-04-12 MED ORDER — FENTANYL CITRATE (PF) 100 MCG/2ML IJ SOLN: 50.0000 ug | INTRAMUSCULAR | Status: DC | PRN

## 2016-04-12 MED ORDER — DICYCLOMINE HCL 10 MG/ML IM SOLN
20.0000 mg | Freq: Once | INTRAMUSCULAR | Status: AC
  Administered 2016-04-12: 20 mg via INTRAMUSCULAR
  Filled 2016-04-12: qty 2

## 2016-04-12 MED ORDER — DICYCLOMINE HCL 20 MG PO TABS: 20.0000 mg | ORAL_TABLET | Freq: Three times a day (TID) | ORAL | 0 refills | Status: DC | PRN

## 2016-04-12 MED ORDER — IOPAMIDOL (ISOVUE-300) INJECTION 61%
100.0000 mL | Freq: Once | INTRAVENOUS | Status: AC | PRN
  Administered 2016-04-12: 100 mL via INTRAVENOUS

## 2016-04-12 MED ORDER — ONDANSETRON HCL 4 MG/2ML IJ SOLN: 4.0000 mg | Freq: Once | INTRAMUSCULAR | Status: DC | PRN

## 2016-04-12 MED ORDER — IOPAMIDOL (ISOVUE-300) INJECTION 61%
30.0000 mL | Freq: Once | INTRAVENOUS | Status: AC
  Administered 2016-04-12: 30 mL via ORAL

## 2016-04-12 MED ORDER — SUCRALFATE 1 G PO TABS
1.0000 g | ORAL_TABLET | Freq: Four times a day (QID) | ORAL | 0 refills | Status: DC
Start: 2016-04-12 — End: 2022-02-22

## 2016-04-12 MED ORDER — ONDANSETRON HCL 4 MG/2ML IJ SOLN
INTRAMUSCULAR | Status: AC
  Administered 2016-04-12: 4 mg via INTRAVENOUS
  Filled 2016-04-12: qty 2

## 2016-04-12 MED ORDER — SODIUM CHLORIDE 0.9 % IV BOLUS (SEPSIS)
1000.0000 mL | Freq: Once | INTRAVENOUS | Status: AC
  Administered 2016-04-12: 1000 mL via INTRAVENOUS

## 2016-04-12 NOTE — ED Notes (Signed)
Patient transported to CT 

## 2016-04-12 NOTE — Progress Notes (Addendum)
Patient: Jessica Bradshaw Female    DOB: [birth year]   55 y.o.   MRN: [id number] Visit Date: 04/12/2016  Today's Provider: Trinna Post, PA-C   Chief Complaint  Patient presents with  . Abdominal Pain    History of IBS most recent flare started Sunday.   Subjective:    Abdominal Pain  This is a recurrent problem. The current episode started in the past 7 days. The problem occurs constantly. The pain is located in the generalized abdominal region. The pain is at a severity of 9/10. The quality of the pain is sharp. The abdominal pain does not radiate. Associated symptoms include constipation. Pertinent negatives include no diarrhea, nausea or vomiting. The pain is aggravated by eating. She has tried oral narcotic analgesics for the symptoms. The treatment provided mild relief.   Patient is a 55 y/o female with history of IBS and SIBO treated at the [facility], most recently on a 28 day course of doxycycline for her SIBO, presenting today with what she calls her usual IBS flare. She says it started Saturday with abdominal pain and cramping. At this point she stopped eating because she was scared of the vomiting that normally accompanies this. She did have a small formed stool this morning that was non-bloody. She has not vomited. She has not been febrile. She has been urinating. She drank a cup of tea this morning. The pain does not radiate it anywhere, but she describes it like labor pains and a severity of 9/10. She hasn't taken her medications today. She reports she normally goes to the emergency room and gets morphine and an anti-emetic which clears it up. When asked why she came here instead of the emergency room, she replies "I didn't want to wait in the Er. Can you make them order something for me?"    Allergies  Allergen Reactions  . Esomeprazole     Other reaction(s): Chest pain, Cough  . Lisinopril Cough     Current Outpatient Prescriptions:  .  ALPRAZolam  (XANAX) 0.5 MG tablet, Take 1 tablet (0.5 mg total) by mouth 2 (two) times daily as needed for anxiety., Disp: 40 tablet, Rfl: 0 .  Cholecalciferol (VITAMIN D) 2000 units CAPS, Take by mouth., Disp: , Rfl:  .  [employer] OMEGA-3 KRILL OIL 500 MG CAPS, Take by mouth., Disp: , Rfl:  .  DIGESTIVE ENZYMES PO, Take by mouth., Disp: , Rfl:  .  fluticasone (FLONASE) 50 MCG/ACT nasal spray, USE 2 SPRAYS IN EACH NOSTRIL ONCE DAILY, Disp: 48 g, Rfl: 3 .  Glutamine 500 MG CAPS, Take by mouth., Disp: , Rfl:  .  LINZESS 145 MCG CAPS capsule, Take 145 mcg by mouth daily., Disp: , Rfl: 3 .  losartan (COZAAR) 50 MG tablet, TAKE 1 TABLET (50 MG TOTAL) BY MOUTH DAILY., Disp: 90 tablet, Rfl: 1 .  metoprolol succinate (TOPROL-XL) 50 MG 24 hr tablet, Take 1 tablet (50 mg total) by mouth daily. Take with or immediately following a meal., Disp: 90 tablet, Rfl: 1 .  Misc Natural Products (OSTEO BI-FLEX JOINT SHIELD PO), Take by mouth., Disp: , Rfl:  .  montelukast (SINGULAIR) 10 MG tablet, TAKE 1 TABLET EVERY DAY, Disp: 90 tablet, Rfl: 1 .  sucralfate (CARAFATE) 1 g tablet, Take 1 tablet (1 g total) by mouth 4 (four) times daily -  with meals and at bedtime., Disp: 12 tablet, Rfl: 0 .  UNABLE TO FIND, Med Name: Atrantil for abd  discomfort, Disp: , Rfl:   Review of Systems  Constitutional: Negative.   Gastrointestinal: Positive for abdominal distention, abdominal pain and constipation. Negative for anal bleeding, blood in stool, diarrhea, nausea, rectal pain and vomiting.  Genitourinary: Negative.     Social History  Substance Use Topics  . Smoking status: Never Smoker  . Smokeless tobacco: Never Used  . Alcohol use No     Comment: socially   Objective:   BP 134/82 (BP Location: Left Arm, Patient Position: Sitting, Cuff Size: Normal)   Pulse 88   Temp 98.5 F (36.9 C) (Oral)   Resp 16   Physical Exam  Constitutional: She is oriented to person, place, and time. She appears well-developed and well-nourished.   Non-toxic appearance. She appears distressed.  Patient is hunched over in chair upon my entering the room. She shuffles over to the exam table and is tearful during the exam.  Cardiovascular: Normal rate.   Pulmonary/Chest: Effort normal.  Abdominal: Soft. Normal appearance. She exhibits no distension and no mass. Bowel sounds are decreased. There is tenderness in the left lower quadrant. There is guarding. There is no rigidity, no rebound, no tenderness at McBurney's point and negative Murphy's sign.  Neurological: She is alert and oriented to person, place, and time.  Skin: Skin is warm and dry.        Assessment & Plan:      Problem List Items Addressed This Visit      Digestive   IBS (irritable bowel syndrome) - Primary   Chronic constipation     Endocrine   Hyperparathyroidism [facility])     Patient is a 55 y/o female presenting with severe abdominal pain. She is nontoxic, afebrile, euvolemic appearing. However, she is visibly distressed. Patient is adamant that this is a flare of her IBS and happens routinely, but LLQ tenderness on exam is concerning for pathology like diverticulitis. Have informed patient that we do not have admitting privileges and we cannot make anybody order her morphine, but we can alert the charge nurse of her arrival. Have directed this patient to the [facility] emergency room and have called ahead.  Return if symptoms worsen or fail to improve.   The entirety of the information documented in the History of Present Illness, Review of Systems and Physical Exam were personally obtained by me. Portions of this information were initially documented by Ashley Royalty, CMA and reviewed by me for thoroughness and accuracy.    Patient Instructions  Irritable Bowel Syndrome, Adult Irritable bowel syndrome (IBS) is not one specific disease. It is a group of symptoms that affects the organs responsible for digestion (gastrointestinal or GI tract). To regulate how your GI tract  works, your body sends signals back and forth between your intestines and your brain. If you have IBS, there may be a problem with these signals. As a result, your GI tract does not function normally. Your intestines may become more sensitive and overreact to certain things. This is especially true when you eat certain foods or when you are under stress. There are four types of IBS. These may be determined based on the consistency of your stool:  IBS with diarrhea.  IBS with constipation.  Mixed IBS.  Unsubtyped IBS. It is important to know which type of IBS you have. Some treatments are more likely to be helpful for certain types of IBS. What are the causes? The exact cause of IBS is not known. What increases the risk? You may have a higher  risk of IBS if:  You are a woman.  You are younger than 55 years old.  You have a family history of IBS.  You have mental health problems.  You have had bacterial infection of your GI tract. What are the signs or symptoms? Symptoms of IBS vary from person to person. The main symptom is abdominal pain or discomfort. Additional symptoms usually include one or more of the following:  Diarrhea, constipation, or both.  Abdominal swelling or bloating.  Feeling full or sick after eating a small or regular-size meal.  Frequent gas.  Mucus in the stool.  A feeling of having more stool left after a bowel movement. Symptoms tend to come and go. They may be associated with stress, psychiatric conditions, or nothing at all. How is this diagnosed? There is no specific test to diagnose IBS. Your health care provider will make a diagnosis based on a physical exam, medical history, and your symptoms. You may have other tests to rule out other conditions that may be causing your symptoms. These may include:  Blood tests.  X-rays.  CT scan.  Endoscopy and colonoscopy. This is a test in which your GI tract is viewed with a long, thin, flexible  tube. How is this treated? There is no cure for IBS, but treatment can help relieve symptoms. IBS treatment often includes:  Changes to your diet, such as:  Eating more fiber.  Avoiding foods that cause symptoms.  Drinking more water.  Eating regular, medium-sized portioned meals.  Medicines. These may include:  Fiber supplements if you have constipation.  Medicine to control diarrhea (antidiarrheal medicines).  Medicine to help control muscle spasms in your GI tract (antispasmodic medicines).  Medicines to help with any mental health issues, such as antidepressants or tranquilizers.  Therapy.  Talk therapy may help with anxiety, depression, or other mental health issues that can make IBS symptoms worse.  Stress reduction.  Managing your stress can help keep symptoms under control. Follow these instructions at home:  Take medicines only as directed by your health care provider.  Eat a healthy diet.  Avoid foods and drinks with added sugar.  Include more whole grains, fruits, and vegetables gradually into your diet. This may be especially helpful if you have IBS with constipation.  Avoid any foods and drinks that make your symptoms worse. These may include dairy products and caffeinated or carbonated drinks.  Do not eat large meals.  Drink enough fluid to keep your urine clear or pale yellow.  Exercise regularly. Ask your health care provider for recommendations of good activities for you.  Keep all follow-up visits as directed by your health care provider. This is important. Contact a health care provider if:  You have constant pain.  You have trouble or pain with swallowing.  You have worsening diarrhea. Get help right away if:  You have severe and worsening abdominal pain.  You have diarrhea and:  You have a rash, stiff neck, or severe headache.  You are irritable, sleepy, or difficult to awaken.  You are weak, dizzy, or extremely thirsty.  You  have bright red blood in your stool or you have black tarry stools.  You have unusual abdominal swelling that is painful.  You vomit continuously.  You vomit blood (hematemesis).  You have both abdominal pain and a fever. This information is not intended to replace advice given to you by your health care provider. Make sure you discuss any questions you have with your health care  provider. Document Released: 04/11/2005 Document Revised: 09/11/2015 Document Reviewed: 12/27/2013 Elsevier Interactive Patient Education  2017 [place], [facility]

## 2016-04-12 NOTE — Discharge Instructions (Signed)
Please seek medical attention for any high fevers, chest pain, shortness of breath, change in behavior, persistent vomiting, bloody stool or any other new or concerning symptoms.  

## 2016-04-12 NOTE — ED Triage Notes (Signed)
Sent over from PCP with abd pain  HX of IBS

## 2016-04-12 NOTE — ED Provider Notes (Signed)
[facility] Emergency Department Provider Note   ____________________________________________   I have reviewed the triage vital signs and the nursing notes.   HISTORY  Chief Complaint Abdominal Pain   History limited by: Not Limited   HPI Jessica Bradshaw is a 55 y.o. female who presents to the emergency department today because of concern for abdominal pain. It is located throughout her abdomen. The patient states the pain started a few days ago. It has been constant. It is severe. It has been associated with nausea and vomiting. Patient states it feels like her previous IBS flares. Was sent to the emergency department from PCP's office. No fevers.   Past Medical History:  Diagnosis Date  . Allergy   . Hypertension     Patient Active Problem List   Diagnosis Date Noted  . Chronic constipation 07/29/2015  . Dry eye syndrome 06/08/2015  . Acid reflux 06/08/2015  . Arthritis, degenerative 06/08/2015  . Allergic rhinitis, seasonal 06/08/2015  . Gastrointestinal bleeding, upper 06/08/2015  . IBS (irritable bowel syndrome) 06/08/2015  . BP (high blood pressure) 02/23/2015  . Hyperparathyroidism ([place]) 02/09/2015    Past Surgical History:  Procedure Laterality Date  . ABDOMINAL HYSTERECTOMY  2007  . CESAREAN SECTION  1985  . HIATAL HERNIA REPAIR  2001  . TONSILLECTOMY    . transoral incisionless fundoplication  [id number]    Prior to Admission medications   Medication Sig Start Date End Date Taking? Authorizing Provider  ALPRAZolam Duanne Moron) 0.5 MG tablet Take 1 tablet (0.5 mg total) by mouth 2 (two) times daily as needed for anxiety. 09/30/14   Margarita Rana, MD  Cholecalciferol (VITAMIN D) 2000 units CAPS Take by mouth.    Historical Provider, MD  CVS OMEGA-3 KRILL OIL 500 MG CAPS Take by mouth.    Historical Provider, MD  DIGESTIVE ENZYMES PO Take by mouth.    Historical Provider, MD  fluticasone (FLONASE) 50 MCG/ACT nasal spray USE 2 SPRAYS IN  EACH NOSTRIL ONCE DAILY 12/17/14   Margarita Rana, MD  Glutamine 500 MG CAPS Take by mouth.    Historical Provider, MD  LINZESS 145 MCG CAPS capsule Take 145 mcg by mouth daily. 02/08/16   Historical Provider, MD  losartan (COZAAR) 50 MG tablet TAKE 1 TABLET (50 MG TOTAL) BY MOUTH DAILY. 01/08/16   Vickki Muff Chrismon, PA  metoprolol succinate (TOPROL-XL) 50 MG 24 hr tablet Take 1 tablet (50 mg total) by mouth daily. Take with or immediately following a meal. 06/08/15   Margarita Rana, MD  Misc Natural Products (OSTEO BI-FLEX JOINT SHIELD PO) Take by mouth.    Historical Provider, MD  montelukast (SINGULAIR) 10 MG tablet TAKE 1 TABLET EVERY DAY 01/08/16   Mar Daring, PA-C  sucralfate (CARAFATE) 1 g tablet Take 1 tablet (1 g total) by mouth 4 (four) times daily -  with meals and at bedtime. 03/18/16   Carmon Ginsberg, PA  UNABLE TO FIND Med Name: Atrantil for abd discomfort    Historical Provider, MD    Allergies Esomeprazole and Lisinopril  Family History  Problem Relation Age of Onset  . Osteoporosis Mother   . Diabetes Father   . Heart disease Father   . Congestive Heart Failure Father   . Hypertension Father   . Stroke Father   . Heart attack Father   . Glaucoma Father   . AAA (abdominal aortic aneurysm) Son   . HIV Brother   . Hypertension Brother   . Hypertension Brother   .  Drug abuse Maternal Uncle     Social History Social History  Substance Use Topics  . Smoking status: Never Smoker  . Smokeless tobacco: Never Used  . Alcohol use No     Comment: socially    Review of Systems  Constitutional: Negative for fever. Cardiovascular: Negative for chest pain. Respiratory: Negative for shortness of breath. Gastrointestinal: Positive for abdominal pain, nausea and vomiting. Genitourinary: Negative for dysuria. Musculoskeletal: Negative for back pain. Skin: Negative for rash. Neurological: Negative for headaches, focal weakness or numbness.  10-point ROS otherwise  negative.  ____________________________________________   PHYSICAL EXAM:  VITAL SIGNS: ED Triage Vitals [04/12/16 1508]  Enc Vitals Group     BP (!) 142/82     Pulse Rate 99     Resp 20     Temp 98.7 F (37.1 C)     Temp Source Oral     SpO2 100 %     Weight 135 lb (61.2 kg)     Height 5\' 5"  (1.651 m)   Constitutional: Alert and oriented. Appears uncomfortable. Eyes: Conjunctivae are normal. Normal extraocular movements. ENT   Head: Normocephalic and atraumatic.   Nose: No congestion/rhinnorhea.   Mouth/Throat: Mucous membranes are moist.   Neck: No stridor. Hematological/Lymphatic/Immunilogical: No cervical lymphadenopathy. Cardiovascular: Normal rate, regular rhythm.  No murmurs, rubs, or gallops.  Respiratory: Normal respiratory effort without tachypnea nor retractions. Breath sounds are clear and equal bilaterally. No wheezes/rales/rhonchi. Gastrointestinal: Soft and tender to palpation. Worse in the lower abdomen.  Genitourinary: Deferred Musculoskeletal: Normal range of motion in all extremities. No lower extremity edema. Neurologic:  Normal speech and language. No gross focal neurologic deficits are appreciated.  Skin:  Skin is warm, dry and intact. No rash noted. Psychiatric: Mood and affect are normal. Speech and behavior are normal. Patient exhibits appropriate insight and judgment.  ____________________________________________    LABS (pertinent positives/negatives)  Labs Reviewed  COMPREHENSIVE METABOLIC PANEL - Abnormal; Notable for the following:       Result Value   Sodium 134 (*)    Chloride 97 (*)    Glucose, Bld 102 (*)    Total Protein 8.2 (*)    All other components within normal limits  CBC - Abnormal; Notable for the following:    WBC 20.3 (*)    RBC 5.41 (*)    MCV 79.4 (*)    MCH 25.9 (*)    RDW 16.1 (*)    All other components within normal limits  LIPASE, BLOOD  URINALYSIS, COMPLETE (UACMP) WITH MICROSCOPIC    ____________________________________________   EKG  None  ____________________________________________    RADIOLOGY  CT abd/pel IMPRESSION:  1. Increased size of hypoattenuating mass at the aortic bifurcation.  This may be a gastrointestinal stromal tumor, carcinoid tumor or  possibly a metastatic nodal deposit.  2. New left hepatic lobe mass measuring up to 7.6 cm. 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 Histologic sampling  should be considered.  3. Multiple prominent loops of central abdominal small bowel may  indicate early/developing obstruction versus local adynamic ileus.   ____________________________________________   PROCEDURES  Procedures  ____________________________________________   INITIAL IMPRESSION / ASSESSMENT AND PLAN / ED COURSE  Pertinent labs & imaging results that were available during my care of the patient were reviewed by me and considered in my medical decision making (see chart for details).  Patient presented to the emergency department today with concerns for abdominal pain likely secondary to the patient's IBS. The patient did  have a leukocytosis of 20. Given this finding a CT scan was obtained. CT scan doesn't show any concerning signs of infection however it was a concerning liver mass as well as a mass around the aortic bifurcation. I had a discussion with the patient about these findings. I expressed my concern for possible metastatic cancer. Patient stated that she was once told she had something on her liver however does not sound like there is any follow-up on that. Will give patient oncology primary care follow-up information.  ____________________________________________   FINAL CLINICAL IMPRESSION(S) / ED DIAGNOSES  Final diagnoses:  Irritable bowel syndrome, unspecified type  Liver mass     Note: This dictation was prepared with Dragon dictation. Any transcriptional errors that result from this process are  unintentional     Nance Pear, MD 04/12/16 [phone]

## 2016-04-12 NOTE — Patient Instructions (Signed)
Irritable Bowel Syndrome, Adult Irritable bowel syndrome (IBS) is not one specific disease. It is a group of symptoms that affects the organs responsible for digestion (gastrointestinal or GI tract). To regulate how your GI tract works, your body sends signals back and forth between your intestines and your brain. If you have IBS, there may be a problem with these signals. As a result, your GI tract does not function normally. Your intestines may become more sensitive and overreact to certain things. This is especially true when you eat certain foods or when you are under stress. There are four types of IBS. These may be determined based on the consistency of your stool:  IBS with diarrhea.  IBS with constipation.  Mixed IBS.  Unsubtyped IBS. It is important to know which type of IBS you have. Some treatments are more likely to be helpful for certain types of IBS. What are the causes? The exact cause of IBS is not known. What increases the risk? You may have a higher risk of IBS if:  You are a woman.  You are younger than 55 years old.  You have a family history of IBS.  You have mental health problems.  You have had bacterial infection of your GI tract. What are the signs or symptoms? Symptoms of IBS vary from person to person. The main symptom is abdominal pain or discomfort. Additional symptoms usually include one or more of the following:  Diarrhea, constipation, or both.  Abdominal swelling or bloating.  Feeling full or sick after eating a small or regular-size meal.  Frequent gas.  Mucus in the stool.  A feeling of having more stool left after a bowel movement. Symptoms tend to come and go. They may be associated with stress, psychiatric conditions, or nothing at all. How is this diagnosed? There is no specific test to diagnose IBS. Your health care provider will make a diagnosis based on a physical exam, medical history, and your symptoms. You may have other tests to  rule out other conditions that may be causing your symptoms. These may include:  Blood tests.  X-rays.  CT scan.  Endoscopy and colonoscopy. This is a test in which your GI tract is viewed with a long, thin, flexible tube. How is this treated? There is no cure for IBS, but treatment can help relieve symptoms. IBS treatment often includes:  Changes to your diet, such as:  Eating more fiber.  Avoiding foods that cause symptoms.  Drinking more water.  Eating regular, medium-sized portioned meals.  Medicines. These may include:  Fiber supplements if you have constipation.  Medicine to control diarrhea (antidiarrheal medicines).  Medicine to help control muscle spasms in your GI tract (antispasmodic medicines).  Medicines to help with any mental health issues, such as antidepressants or tranquilizers.  Therapy.  Talk therapy may help with anxiety, depression, or other mental health issues that can make IBS symptoms worse.  Stress reduction.  Managing your stress can help keep symptoms under control. Follow these instructions at home:  Take medicines only as directed by your health care provider.  Eat a healthy diet.  Avoid foods and drinks with added sugar.  Include more whole grains, fruits, and vegetables gradually into your diet. This may be especially helpful if you have IBS with constipation.  Avoid any foods and drinks that make your symptoms worse. These may include dairy products and caffeinated or carbonated drinks.  Do not eat large meals.  Drink enough fluid to keep your urine   clear or pale yellow.  Exercise regularly. Ask your health care provider for recommendations of good activities for you.  Keep all follow-up visits as directed by your health care provider. This is important. Contact a health care provider if:  You have constant pain.  You have trouble or pain with swallowing.  You have worsening diarrhea. Get help right away if:  You  have severe and worsening abdominal pain.  You have diarrhea and:  You have a rash, stiff neck, or severe headache.  You are irritable, sleepy, or difficult to awaken.  You are weak, dizzy, or extremely thirsty.  You have bright red blood in your stool or you have black tarry stools.  You have unusual abdominal swelling that is painful.  You vomit continuously.  You vomit blood (hematemesis).  You have both abdominal pain and a fever. This information is not intended to replace advice given to you by your health care provider. Make sure you discuss any questions you have with your health care provider. Document Released: 04/11/2005 Document Revised: 09/11/2015 Document Reviewed: 12/27/2013 Elsevier Interactive Patient Education  2017 [employer]  

## 2016-04-12 NOTE — ED Notes (Signed)
Patient screaming out, dry heaving.  Tachycardic and tachypnic.

## 2016-04-19 ENCOUNTER — Ambulatory Visit: Payer: BLUE CROSS/BLUE SHIELD | Admitting: Oncology

## 2016-04-25 DIAGNOSIS — C801 Malignant (primary) neoplasm, unspecified: Secondary | ICD-10-CM

## 2016-04-25 DIAGNOSIS — Z923 Personal history of irradiation: Secondary | ICD-10-CM

## 2016-04-25 HISTORY — DX: Personal history of irradiation: Z92.3

## 2016-04-25 HISTORY — DX: Malignant (primary) neoplasm, unspecified: C80.1

## 2016-05-03 ENCOUNTER — Telehealth: Payer: Self-pay | Admitting: Physical Therapy

## 2016-05-03 NOTE — Telephone Encounter (Signed)
PT corresponded with pt via email re: upcoming appt. Pt updated PT re: pt's recent findings and pending work-up to diagnosis the status of the mass on her liver. PT informed pt to discontinue gentle pelvic massages and pt voiced understanding. Pt plans to attend PT session scheduled on 1/17 and POC will be modified accordingly. PT plans to communication with her MDs for continuation of PT or to withhold.

## 2016-05-11 ENCOUNTER — Ambulatory Visit: Payer: BLUE CROSS/BLUE SHIELD | Admitting: Physical Therapy

## 2016-06-01 ENCOUNTER — Encounter: Payer: BLUE CROSS/BLUE SHIELD | Admitting: Physical Therapy

## 2016-06-09 ENCOUNTER — Other Ambulatory Visit: Payer: Self-pay | Admitting: Family Medicine

## 2016-06-20 DIAGNOSIS — D3A8 Other benign neuroendocrine tumors: Secondary | ICD-10-CM | POA: Diagnosis not present

## 2016-06-20 DIAGNOSIS — C7A8 Other malignant neuroendocrine tumors: Secondary | ICD-10-CM | POA: Diagnosis not present

## 2016-06-20 DIAGNOSIS — C7B8 Other secondary neuroendocrine tumors: Secondary | ICD-10-CM | POA: Diagnosis not present

## 2016-06-22 DIAGNOSIS — C7A8 Other malignant neuroendocrine tumors: Secondary | ICD-10-CM | POA: Diagnosis not present

## 2016-06-22 DIAGNOSIS — C7B8 Other secondary neuroendocrine tumors: Secondary | ICD-10-CM | POA: Diagnosis not present

## 2016-06-27 ENCOUNTER — Other Ambulatory Visit: Payer: Self-pay

## 2016-06-27 DIAGNOSIS — I10 Essential (primary) hypertension: Secondary | ICD-10-CM

## 2016-06-27 MED ORDER — METOPROLOL SUCCINATE ER 50 MG PO TB24: 50.0000 mg | ORAL_TABLET | Freq: Every day | ORAL | 1 refills | Status: DC

## 2016-06-27 NOTE — Telephone Encounter (Signed)
Last ov 04/12/16 Adriana Last filled 2/13/207 Please review. Thank you. sd

## 2016-06-29 ENCOUNTER — Other Ambulatory Visit: Payer: Self-pay | Admitting: Family Medicine

## 2016-06-29 DIAGNOSIS — I10 Essential (primary) hypertension: Secondary | ICD-10-CM

## 2016-07-06 ENCOUNTER — Ambulatory Visit: Payer: BLUE CROSS/BLUE SHIELD | Attending: Internal Medicine | Admitting: Physical Therapy

## 2016-07-06 DIAGNOSIS — M533 Sacrococcygeal disorders, not elsewhere classified: Secondary | ICD-10-CM | POA: Diagnosis not present

## 2016-07-06 DIAGNOSIS — M25551 Pain in right hip: Secondary | ICD-10-CM | POA: Diagnosis not present

## 2016-07-06 DIAGNOSIS — M25572 Pain in left ankle and joints of left foot: Secondary | ICD-10-CM | POA: Diagnosis not present

## 2016-07-06 DIAGNOSIS — R2689 Other abnormalities of gait and mobility: Secondary | ICD-10-CM | POA: Diagnosis not present

## 2016-07-07 NOTE — Therapy (Deleted)
[place] MAIN [facility] SERVICES [address] [place], [place], [address] Phone: [phone]   Fax:  [phone]  Physical Therapy Evaluation  Patient Details  Name: Jessica Bradshaw MRN: [id number] Date of Birth: [birth year] Referring Provider: Venia Minks  Encounter Date: 07/06/2016      PT End of Session - 08/06/16 1314    Visit Number 1   Activity Tolerance Patient tolerated treatment well;No increased pain   Behavior During Therapy WFL for tasks assessed/performed      Past Medical History:  Diagnosis Date  . Allergy   . Hypertension     Past Surgical History:  Procedure Laterality Date  . ABDOMINAL HYSTERECTOMY  2007  . CESAREAN SECTION  1985  . HIATAL HERNIA REPAIR  2001  . TONSILLECTOMY    . transoral incisionless fundoplication  7253    There were no vitals filed for this visit.       Subjective Assessment - 08/06/16 1238    Subjective Pt underwent laproscopic surgery to  15 lymph nodes, 1/3 of liver, 62ft of small intestine and gall bladder 05/26/16 to remove malignant tumor.  Before surgery pt had not need to take Linzess and after surgery she has taken My Miracle Tea which she was introduced to from a friend. This tea has helped "amazingly" with well formed stools 1-2 x time. Since the last couple of weeks, pt has started feeling L hip and R  foot pain at the intensity level she first started coming to PT last year. The days she is more active she feel it less. Her R foot is hurting in the bones and when not walking. Pt was inactive for 6 weeks due to post surgery protocol.  Pt has returned to walking  2x week, 2-3 miles per week.     Pertinent History Hx of fibroids, leading hysterectomy (abdominal). also TIF, and emergency C-section. SIBO 3x with antibiotics Tx in the past. 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  Pt injuried her neck (herniated disk) with lifting son.     Patient Stated Goals return to being able to active without pain (walking, treadmilling, gym classes)             [facility] PT Assessment - 08/06/16 1238      Observation/Other Assessments   Observations upright posture, feet with increased arches compared to last year (bilaterally)      Coordination   Gross Motor Movements are Fluid and Coordinated --  deep core stability with leg movements    Fine Motor Movements are Fluid and Coordinated --  pelvic floor co-activation with cues     Functional Tests   Functional tests --  no pulling sensation at hips with warrior twist pose      Palpation   SI assessment  R PSIS more posterior    Palpation comment Tenderness. tensions along adductor hallucis transverse head mm between digit I and II  on L foot                   [facility] Adult PT Treatment/Exercise - 08/06/16 1305      Therapeutic Activites    Therapeutic Activities --  discussed continuation of walking,  Discontinue  abdominal massage./ deep core exercise which she learned last year.                PT Education - 08/06/16 1312    Education provided Yes  Education Details PT to communicate with MD for contraindications. Discontinue abdominal massage, deep core exercises. Continue walking for core strengthening.     Person(s) Educated Patient   Methods Explanation;Demonstration;Tactile cues;Verbal cues   Comprehension Verbalized understanding             PT Long Term Goals - 08/06/16 1318      PT LONG TERM GOAL #1   Title Pt will demo no pelvic obliquities in order to decrease hip pain to return to exercises   Time 12   Period Weeks   Status New     PT LONG TERM GOAL #2   Title Pt will report no foot pain across 2 weeks in order to increase distance in  walking routine    Time 12   Period Weeks   Status New     PT LONG TERM GOAL #3   Title Pt will demo proper alignment techniques with yoga poses in order to minimize injuries and SIJ  malalignment.   Time 12   Period Weeks   Status --                   Status --               Plan - 08/06/16 1315    Clinical Impression Statement Pt is 57 yo female who complains of hip and foot pain. These Sx are simi   Rehab Potential Good   Clinical Impairments Affecting Rehab Potential multiple abdominal surgeries, chronicity of Sx   PT Frequency 1x / week   PT Duration 12 weeks      Patient will benefit from skilled therapeutic intervention in order to improve the following deficits and impairments:  Increased fascial restricitons, Improper body mechanics, Pain, Decreased scar mobility, Increased muscle spasms, Postural dysfunction, Decreased endurance, Decreased range of motion, Decreased strength, Impaired flexibility, Decreased mobility, Decreased coordination, Decreased activity tolerance  Visit Diagnosis: Pain in left ankle and joints of left foot  Pain in right hip  Sacrococcygeal disorders, not elsewhere classified  Other abnormalities of gait and mobility     Problem List Patient Active Problem List   Diagnosis Date Noted  . Chronic constipation 07/29/2015  . Dry eye syndrome 06/08/2015  . Acid reflux 06/08/2015  . Arthritis, degenerative 06/08/2015  . Allergic rhinitis, seasonal 06/08/2015  . Gastrointestinal bleeding, upper 06/08/2015  . IBS (irritable bowel syndrome) 06/08/2015  . BP (high blood pressure) 02/23/2015  . Hyperparathyroidism ([place]) 02/09/2015    Jerl Mina ,PT, DPT, E-RYT  08/06/2016, 1:34 PM  [place] MAIN [facility] SERVICES [address] [place], [place], [address] Phone: [phone]   Fax:  [phone]  Name: ANAISA RADI MRN: [id number] Date of Birth: [birth year]

## 2016-07-11 DIAGNOSIS — Z1272 Encounter for screening for malignant neoplasm of vagina: Secondary | ICD-10-CM | POA: Diagnosis not present

## 2016-07-11 DIAGNOSIS — Z01419 Encounter for gynecological examination (general) (routine) without abnormal findings: Secondary | ICD-10-CM | POA: Diagnosis not present

## 2016-07-12 ENCOUNTER — Other Ambulatory Visit: Payer: Self-pay

## 2016-07-12 ENCOUNTER — Other Ambulatory Visit: Payer: Self-pay | Admitting: Physician Assistant

## 2016-07-12 DIAGNOSIS — J302 Other seasonal allergic rhinitis: Secondary | ICD-10-CM

## 2016-07-12 MED ORDER — FLUTICASONE PROPIONATE 50 MCG/ACT NA SUSP: 2.0000 | Freq: Every day | NASAL | 3 refills | Status: DC

## 2016-07-12 NOTE — Telephone Encounter (Signed)
Last ov  04/12/16 Last filled 12/17/14 Please review. Thank you. sd

## 2016-07-13 ENCOUNTER — Other Ambulatory Visit: Payer: Self-pay | Admitting: Obstetrics and Gynecology

## 2016-07-13 DIAGNOSIS — Z1231 Encounter for screening mammogram for malignant neoplasm of breast: Secondary | ICD-10-CM

## 2016-07-15 ENCOUNTER — Telehealth: Payer: Self-pay | Admitting: Physical Therapy

## 2016-07-15 NOTE — Telephone Encounter (Signed)
PT phoned and left message this afternoon 07/15/16  with staff at Dr. Eldred Manges 's office [facility] [phone] Option #3) if it will be okay to treat pt's hip and foot pain with PT or if MDs would like to withhold PT until her PET scan which is scheduled on 08/17/16 at the [facility].  PT has tried to get through Dr. Skeet Simmer nurse line [phone]) last week and this week without success.

## 2016-07-28 ENCOUNTER — Ambulatory Visit: Payer: BLUE CROSS/BLUE SHIELD | Admitting: Physical Therapy

## 2016-08-01 ENCOUNTER — Ambulatory Visit: Payer: BLUE CROSS/BLUE SHIELD | Attending: Internal Medicine | Admitting: Physical Therapy

## 2016-08-01 DIAGNOSIS — M25572 Pain in left ankle and joints of left foot: Secondary | ICD-10-CM | POA: Diagnosis not present

## 2016-08-01 DIAGNOSIS — M25551 Pain in right hip: Secondary | ICD-10-CM

## 2016-08-01 DIAGNOSIS — R293 Abnormal posture: Secondary | ICD-10-CM | POA: Diagnosis not present

## 2016-08-01 DIAGNOSIS — R279 Unspecified lack of coordination: Secondary | ICD-10-CM | POA: Diagnosis not present

## 2016-08-01 DIAGNOSIS — M6281 Muscle weakness (generalized): Secondary | ICD-10-CM | POA: Insufficient documentation

## 2016-08-01 DIAGNOSIS — R2689 Other abnormalities of gait and mobility: Secondary | ICD-10-CM

## 2016-08-01 DIAGNOSIS — M533 Sacrococcygeal disorders, not elsewhere classified: Secondary | ICD-10-CM | POA: Diagnosis not present

## 2016-08-02 NOTE — Therapy (Signed)
[place] MAIN [facility] SERVICES [address] [place], [place], [address] Phone: [phone]   Fax:  [phone]  Physical Therapy Treatment  Patient Details  Name: Jessica Bradshaw MRN: [id number] Date of Birth: 01/28/1961 Referring Provider: Venia Minks  Encounter Date: 08/01/2016      PT End of Session - 08/07/16 1941    Visit Number 2   Number of Visits 12   PT Start Time 0900   PT Stop Time 1000   PT Time Calculation (min) 60 min      Past Medical History:  Diagnosis Date  . Allergy   . Hypertension     Past Surgical History:  Procedure Laterality Date  . ABDOMINAL HYSTERECTOMY  2007  . CESAREAN SECTION  1985  . HIATAL HERNIA REPAIR  2001  . TONSILLECTOMY    . transoral incisionless fundoplication  5400    There were no vitals filed for this visit.      Subjective Assessment - 08/01/16 1934    Subjective Pt reports her L foot and R hip pain has improved by 90% with continued physical activities. Pt feels it is dependent on getting more movement. The foot pain has been constantly better, the hip pain comes and goes.  Pt has been able to climb 7 flights of stairs without hip pain. She is concerned aboutt the weight gain of 8-11 lbs since her surgery.  PPt has been sleeping better with less hot flashes. Pt also continues ot have no bowel issues.  Pt's goal with PT is to live an active life again without discomfort.     Pertinent History Hx of fibroids, leading hysterectomy (abdominal). also TIF, and emergency C-section. SIBO 3x with antibiotics Tx in the past. 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  Pt injuried her neck (herniated disk) with lifting son.    Patient Stated Goals return to being able to active without pain (walking, treadmilling, gym classes)             [facility] PT Assessment - 08/01/16 1939      Strength   Overall Strength Comments Lower trap L 3+/5, R  4/5.  Gluts 4/5 B      Palpation   SI assessment  PSIS more symmetrical.    Palpation comment less tensions in adductor hallucis transverse in R foot                     [facility] Adult PT Treatment/Exercise - 08/01/16 1940      Therapeutic Activites    Therapeutic Activities --  education on contraindications for CA, collaboarated on goal                PT Education - 08/06/16 1312    Education provided Yes   Education Details PT to communicate with MD for contraindications. Discontinue abdominal massage, deep core exercises. Continue walking for core strengthening.     Person(s) Educated Patient   Methods Explanation;Demonstration;Tactile cues;Verbal cues   Comprehension Verbalized understanding             PT Long Term Goals - 08/01/16 1945      PT LONG TERM GOAL #1   Title Pt will demo no pelvic obliquities in order to decrease hip pain to return to exercises   Time 12   Period Weeks   Status New     PT LONG TERM GOAL #2  Title Pt will report no foot pain across 2 weeks in order to increase distance in  walking routine    Time 12   Period Weeks   Status New     PT LONG TERM GOAL #3   Title Pt will demo proper alignment techniques with yoga poses in order to minimize injuries and SIJ malalignment.   Time 12   Period Weeks     PT LONG TERM GOAL #4   Title Pt will be IND with exercise routine (aerobic and strength without placing strain on abdominal area)  for health and prevention.    Time 12   Period Weeks   Status New     PT LONG TERM GOAL #5   Title Pt will demo  increased low trap L strength 3/5 to  4/5 to return to bowling with less risk for injuries ( HEP targetting balance of L/R mm strength)    Time 12   Period Weeks                           Plan - 08/01/16 1942    Clinical Impression Statement Pt demo'd no pelvic obliquities and less foot mm tensions and tenderness. Pt reorts 90% improvement with her foot and hip  pain with increased physical activities since last session where no Tx were applied.  Discussed POC with return to fitness routines that minmize strain on pelvic and abdominal areas. Discussed pt may benefit from a referral to a lymphedema specialist since she had lymph nodes removed 2/2 CA. Pt voiced agreement and will wait to discuss with her [facility] MD when she goes for her CT scan on 08/17/16.  Pt tolerated session today which included trampline walking and resistance band exercises ( PNF for UE and thoracolumbar strengthening)  to help with balance with walking and return to bowling. Added new goal to increase thoacolumbar strength on L.  Pt continues to benefit from skilled PT.      Rehab Potential Good   Clinical Impairments Affecting Rehab Potential multiple abdominal surgeries, chronicity of Sx,  recent Dx of CA with surgerical removal of tumors at liver and pelvic lymph nodes   PT Frequency 1x / week   PT Duration 12 weeks   PT Treatment/Interventions ADLs/Self Care Home Management;Aquatic Therapy;Biofeedback;Electrical Stimulation;Cryotherapy;Gait training;Moist Heat;Stair training;Functional mobility training;Therapeutic activities;Therapeutic exercise;Balance training;Neuromuscular re-education;Manual techniques;Energy conservation;Dry needling;Scar mobilization;Patient/family education;Manual lymph drainage      Patient will benefit from skilled therapeutic intervention in order to improve the following deficits and impairments:  Increased fascial restricitons, Improper body mechanics, Pain, Decreased scar mobility, Increased muscle spasms, Postural dysfunction, Decreased endurance, Decreased range of motion, Decreased strength, Impaired flexibility, Decreased mobility, Decreased coordination, Decreased activity tolerance  Visit Diagnosis: Pain in left ankle and joints of left foot  Pain in right hip  Sacrococcygeal disorders, not elsewhere classified  Other abnormalities of gait  and mobility  Unspecified lack of coordination  Abnormal posture  Muscle weakness (generalized)     Problem List Patient Active Problem List   Diagnosis Date Noted  . Chronic constipation 07/29/2015  . Dry eye syndrome 06/08/2015  . Acid reflux 06/08/2015  . Arthritis, degenerative 06/08/2015  . Allergic rhinitis, seasonal 06/08/2015  . Gastrointestinal bleeding, upper 06/08/2015  . IBS (irritable bowel syndrome) 06/08/2015  . BP (high blood pressure) 02/23/2015  . Hyperparathyroidism ([place]) 02/09/2015    Jerl Mina ,PT, DPT, E-RYT  08/07/2016, 7:48 PM  [place]  [place], [place], [address] Phone: [phone]   Fax:  [phone]  Name: PAOLINA KARWOWSKI MRN: [id number] Date of Birth: 04-01-1961

## 2016-08-03 DIAGNOSIS — L821 Other seborrheic keratosis: Secondary | ICD-10-CM | POA: Diagnosis not present

## 2016-08-03 DIAGNOSIS — I10 Essential (primary) hypertension: Secondary | ICD-10-CM | POA: Diagnosis not present

## 2016-08-03 DIAGNOSIS — L820 Inflamed seborrheic keratosis: Secondary | ICD-10-CM | POA: Diagnosis not present

## 2016-08-03 DIAGNOSIS — D485 Neoplasm of uncertain behavior of skin: Secondary | ICD-10-CM | POA: Diagnosis not present

## 2016-08-03 DIAGNOSIS — D229 Melanocytic nevi, unspecified: Secondary | ICD-10-CM | POA: Diagnosis not present

## 2016-08-03 DIAGNOSIS — D225 Melanocytic nevi of trunk: Secondary | ICD-10-CM | POA: Diagnosis not present

## 2016-08-06 NOTE — Therapy (Addendum)
[place] MAIN [facility] SERVICES [address] [place], [place], [address] Phone: [phone]   Fax:  [phone]  Physical Therapy Evaluation  Patient Details  Name: Jessica Bradshaw MRN: [id number] Date of Birth: 1961/02/11 Referring Provider: Venia Minks  Encounter Date: 07/06/2016      PT End of Session - 08/06/16 1314    Visit Number 1   Activity Tolerance Patient tolerated treatment well;No increased pain   Behavior During Therapy WFL for tasks assessed/performed      Past Medical History:  Diagnosis Date  . Allergy   . Hypertension     Past Surgical History:  Procedure Laterality Date  . ABDOMINAL HYSTERECTOMY  2007  . CESAREAN SECTION  1985  . HIATAL HERNIA REPAIR  2001  . TONSILLECTOMY    . transoral incisionless fundoplication  9983    There were no vitals filed for this visit.       Subjective Assessment - 07/06/16 1238    Subjective Pt underwent laproscopic surgery to  15 lymph nodes, 1/3 of liver, 66ft of small intestine and gall bladder 05/26/16 to remove malignant tumor.  Before surgery pt had not need to take Linzess and after surgery she has taken My Miracle Tea which she was introduced to from a friend. This tea has helped "amazingly" with well formed stools 1-2 x time. Since the last couple of weeks, pt has started feeling L hip and R  foot pain at the intensity level she first started coming to PT last year. The days she is more active she feel it less. Her R foot is hurting in the bones and when not walking. Pt was inactive for 6 weeks due to post surgery protocol.  Pt has returned to walking  2x week, 2-3 miles per week.     Pertinent History Hx of fibroids, leading hysterectomy (abdominal). also TIF, and emergency C-section. SIBO 3x with antibiotics Tx in the past. 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  Pt injuried her neck (herniated disk) with lifting son.     Patient Stated Goals return to being able to active without pain (walking, treadmilling, gym classes)             [facility] PT Assessment - 07/06/16 1238      Observation/Other Assessments   Observations upright posture, feet with increased arches compared to last year (bilaterally)      Coordination   Gross Motor Movements are Fluid and Coordinated --  deep core stability with leg movements    Fine Motor Movements are Fluid and Coordinated --  pelvic floor co-activation with cues     Functional Tests   Functional tests --  no pulling sensation at hips with warrior twist pose      Palpation   SI assessment  R PSIS more posterior    Palpation comment Tenderness. tensions along adductor hallucis transverse head mm between digit I and II  on L foot                   [facility] Adult PT Treatment/Exercise - 07/06/16 1305      Therapeutic Activites    Therapeutic Activities --  discussed continuation of walking,  discontinuat abdominal m                PT Education - 07/06/16 1312    Education provided Yes   Education Details PT to communicate with MD  for contraindications. Discontinue abdominal massage, deep core exercises. Continue walking for core strengthening.     Person(s) Educated Patient   Methods Explanation;Demonstration;Tactile cues;Verbal cues   Comprehension Verbalized understanding            PT Long Term Goals - 08/06/16 1318      PT LONG TERM GOAL #1   Title Pt will demo no pelvic obliquities in order to decrease hip pain to return to exercises   Time 12   Period Weeks   Status New     PT LONG TERM GOAL #2   Title Pt will report no foot pain across 2 weeks in order to increase distance in  walking routine    Time 12   Period Weeks   Status New     PT LONG TERM GOAL #3   Title Pt will demo proper alignment techniques with yoga poses in order to minimize injuries and SIJ malalignment.   Time 12   Period Weeks   Status --     PT  LONG TERM GOAL #4   Title Pt will be IND with exercise routine (aerobic and strength without placing strain on abdominal area)  for health and prevention.    Time 12   Period Weeks   Status New     PT LONG TERM GOAL #5   Title --   Time --   Period --   Status --     PT LONG TERM GOAL #6   Title --   Time 12   Period --   Status --     PT LONG TERM GOAL #7   Title --   Time 12   Period --   Status --     PT LONG TERM GOAL #8   Title --   Time 12   Period --   Status --     PT LONG TERM GOAL  #9   TITLE --   Time 12   Period --   Status --     PT LONG TERM GOAL  #10   TITLE --   Time 12   Period --   Status --     PT LONG TERM GOAL  #11   TITLE --   Time 12   Period --   Status --             Plan - 07/06/16 1334    Clinical Impression Statement Pt is 56 yo female who complains of hip and foot pain. Pt reports she has not been active s/p surgery which removed malignant tumors on her liver and pelvic lymph nodes.  Clinical pressentations include pelvic obliquities and tenderness/tensions at L foot instrinsic mm. Pt showed improved deep core strength and foot arch height compared to her presentation last year with PT. PT plans to communicate with MDs re: contraindications. Plan to  discontinue abdominal massage and manual Tx. No Tx was performed today. Pt will be undergoing a CT scan on 08/17/2016 which will capture a baseline reading of any lesions that may remain present s/p surgery. Appropriate Tx will be determined based on CT findings.    Rehab Potential Good   Clinical Impairments Affecting Rehab Potential multiple abdominal surgeries, chronicity of Sx,  recent Dx of CA with surgerical removal of tumors at liver and pelvic lymph nodes   PT Frequency 1x / week   PT Duration 12 weeks   PT Treatment/Interventions ADLs/Self Care Home Management;Aquatic Therapy;Biofeedback;Electrical Stimulation;Cryotherapy;Gait training;Moist Heat;Stair training;Functional  mobility training;Therapeutic activities;Therapeutic exercise;Balance training;Neuromuscular re-education;Manual techniques;Energy conservation;Dry needling;Scar mobilization;Patient/family education;Manual lymph drainage      Patient will benefit from skilled therapeutic intervention in order to improve the following deficits and impairments:  Increased fascial restricitons, Improper body mechanics, Pain, Decreased scar mobility, Increased muscle spasms, Postural dysfunction, Decreased endurance, Decreased range of motion, Decreased strength, Impaired flexibility, Decreased mobility, Decreased coordination, Decreased activity tolerance  Visit Diagnosis: Pain in left ankle and joints of left foot  Pain in right hip  Sacrococcygeal disorders, not elsewhere classified  Other abnormalities of gait and mobility     Problem List Patient Active Problem List   Diagnosis Date Noted  . Chronic constipation 07/29/2015  . Dry eye syndrome 06/08/2015  . Acid reflux 06/08/2015  . Arthritis, degenerative 06/08/2015  . Allergic rhinitis, seasonal 06/08/2015  . Gastrointestinal bleeding, upper 06/08/2015  . IBS (irritable bowel syndrome) 06/08/2015  . BP (high blood pressure) 02/23/2015  . Hyperparathyroidism ([place]) 02/09/2015    Jerl Mina ,PT, DPT, E-RYT  08/06/2016, 1:35 PM  [place] MAIN [facility] SERVICES [address] [place], [place], [address] Phone: [phone]   Fax:  [phone]  Name: KEAIRRA BARDON MRN: [id number] Date of Birth: March 09, 1961

## 2016-08-06 NOTE — Addendum Note (Signed)
Addended by: Jerl Mina on: 08/06/2016 01:44 PM   Modules accepted: Orders

## 2016-08-07 NOTE — Patient Instructions (Signed)
30 min walking on trampoline with feet hip width x 5 sets  ________ Standing PNF band exercises   "Drawing a sword" " Pulling a lawnmover"  Band is over the edge of the door, knot is on the other side of the door,  R hand holds end of the band by L ear level   Press downward into the ballmounds and heels of the feet, thigh muscle active, Keep knees and hips squared the front and do not move them throughout the activity, Exhale to pull band across the face to the R pocket, rotating only your ribcage    10 x 2 reps    "Pulling seat belt"   Band over door knob with knot on the other side of the door Stand perpendicular to the door,  Exhale to pull band from R ear height across body to the L pocket without turning pelvis and knees  Follow hand with eyes/head  10 x 2 reps

## 2016-08-08 ENCOUNTER — Encounter: Payer: BLUE CROSS/BLUE SHIELD | Admitting: Physical Therapy

## 2016-08-22 ENCOUNTER — Encounter: Payer: BLUE CROSS/BLUE SHIELD | Admitting: Physical Therapy

## 2016-08-24 ENCOUNTER — Ambulatory Visit
Admission: RE | Admit: 2016-08-24 | Discharge: 2016-08-24 | Disposition: A | Discharge status: Home / Self Care | Payer: BLUE CROSS/BLUE SHIELD | Source: Ambulatory Visit | Attending: Obstetrics and Gynecology | Admitting: Obstetrics and Gynecology

## 2016-08-24 DIAGNOSIS — Z1231 Encounter for screening mammogram for malignant neoplasm of breast: Secondary | ICD-10-CM

## 2016-08-24 HISTORY — DX: Malignant (primary) neoplasm, unspecified: C80.1

## 2016-08-25 DIAGNOSIS — K5909 Other constipation: Secondary | ICD-10-CM | POA: Diagnosis not present

## 2016-08-25 DIAGNOSIS — Z23 Encounter for immunization: Secondary | ICD-10-CM | POA: Diagnosis not present

## 2016-08-25 DIAGNOSIS — I10 Essential (primary) hypertension: Secondary | ICD-10-CM | POA: Diagnosis not present

## 2016-08-25 DIAGNOSIS — D3A8 Other benign neuroendocrine tumors: Secondary | ICD-10-CM | POA: Diagnosis not present

## 2016-08-29 ENCOUNTER — Ambulatory Visit: Payer: BLUE CROSS/BLUE SHIELD | Attending: Internal Medicine | Admitting: Physical Therapy

## 2016-08-29 DIAGNOSIS — R2689 Other abnormalities of gait and mobility: Secondary | ICD-10-CM | POA: Insufficient documentation

## 2016-08-29 DIAGNOSIS — M533 Sacrococcygeal disorders, not elsewhere classified: Secondary | ICD-10-CM | POA: Insufficient documentation

## 2016-08-29 DIAGNOSIS — M25572 Pain in left ankle and joints of left foot: Secondary | ICD-10-CM | POA: Insufficient documentation

## 2016-08-29 DIAGNOSIS — M6281 Muscle weakness (generalized): Secondary | ICD-10-CM | POA: Insufficient documentation

## 2016-08-29 DIAGNOSIS — R293 Abnormal posture: Secondary | ICD-10-CM

## 2016-08-29 DIAGNOSIS — R279 Unspecified lack of coordination: Secondary | ICD-10-CM

## 2016-08-29 DIAGNOSIS — M25551 Pain in right hip: Secondary | ICD-10-CM | POA: Diagnosis not present

## 2016-08-29 NOTE — Patient Instructions (Signed)
Walking with slightly wider stance , Feet under hips    Bands at waist(over the hip bone)  Forward/ backwards  against resistance     Standing still: Slightly unlocked knees Spreading ballmound archwider, swiping towards pinky toes to offload the big toe ballmound     Modified planks:  Propped on L elbow (slightly ahead of shoulder), fist active  R foot slides against floor     10 reps   ____   On counter Active feet    5 sec holds, x 5 reps

## 2016-08-30 NOTE — Therapy (Signed)
[place] MAIN [facility] SERVICES [address] [place], [place], [address] Phone: [phone]   Fax:  [phone]  Physical Therapy Treatment  Patient Details  Name: Jessica Bradshaw MRN: [id number] Date of Birth: 07/23/1960 Referring Provider: Venia Minks  Encounter Date: 08/29/2016      PT End of Session - 08/30/16 0859    Visit Number 3   Number of Visits 12   PT Start Time 1010   PT Stop Time 1115   PT Time Calculation (min) 65 min   Activity Tolerance Patient tolerated treatment well;No increased pain   Behavior During Therapy WFL for tasks assessed/performed      Past Medical History:  Diagnosis Date  . Allergy   . Cancer [facility])    Neuroendocrine CA (recent diagnosis)  . Hypertension     Past Surgical History:  Procedure Laterality Date  . ABDOMINAL HYSTERECTOMY  2007  . CESAREAN SECTION  1985  . HIATAL HERNIA REPAIR  2001  . TONSILLECTOMY    . transoral incisionless fundoplication  1324    There were no vitals filed for this visit.      Subjective Assessment - 08/29/16 1018    Subjective Pt reported she went to the [facility] for a PET scan which showed cancer on a lymph node in the abdomen, and other spots in the abdominal cavity, and also the liver lit up but MD suspected the healing process. Pt will undergo Tx with a shot once a month for the rest of her life.  Pt has been Dx with Carcinoid Syndrome which is associated with her hot flashes, weight gain, bloating, constipation.  Pt has gained 16 lbs since the surgery. Pt will be bringing this issue to her Dr. Jackelyn Knife Pt states she also does not enjoy eating . Pt reported her L foot pain at the ballmound, has returned. Pt feels a shooting pain when she is not doing any thing at all. There is also little pain in her arch. This pain is worse compared to after her surgery 7/10.  Pt is able to walk 3 mi/ day and pain comes on sometimes and then it stops after walking after a few  minutes.  Pt has not been able to do the band exercises but when she did them , she had no issues. Her hip pain does not come on as often. It comes on when she sits too much.     Pertinent History Hx of fibroids, leading hysterectomy (abdominal). also TIF, and emergency C-section. SIBO 3x with antibiotics Tx in the past. 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  Pt injuried her neck (herniated disk) with lifting son.    Patient Stated Goals return to being able to active without pain (walking, treadmilling, gym classes)             [facility] PT Assessment - 08/30/16 0900      Posture/Postural Control   Posture Comments improved alignment of ribcage over pelvis      Strength   Overall Strength Comments hip abd R 3/5, L 4/5       Palpation   SI assessment  PSIS more symmetrical      Ambulation/Gait   Gait Pattern --  slight pronation with SLS L    Gait Comments cued for wider BOS -> less foot pain.  [place] Adult PT Treatment/Exercise - 08/30/16 0900      Therapeutic Activites    Therapeutic Activities --  resisted band training at waist 4 x 10 ft , forwardbackward     Neuro Re-ed    Neuro Re-ed Details  see p instructions. gait training                PT Education - 08/30/16 0858    Education provided Yes   Education Details HEP   Person(s) Educated Patient   Methods Explanation;Demonstration;Tactile cues;Verbal cues;Handout   Comprehension Returned demonstration;Verbalized understanding            PT Long Term Goals - 08/30/16 1442      PT LONG TERM GOAL #1   Title Pt will demo no pelvic obliquities in order to decrease hip pain to return to exercises   Time 12   Period Weeks   Status Achieved     PT LONG TERM GOAL #2   Title Pt will report no foot pain across 2 weeks in order to increase distance in  walking routine    Time 12   Period Weeks   Status On-going      PT LONG TERM GOAL #3   Title Pt will demo proper alignment techniques with yoga poses in order to minimize injuries and SIJ malalignment.   Time 12   Period Weeks   Status On-going     PT LONG TERM GOAL #4   Title Pt will be IND with exercise routine (aerobic and strength without placing strain on abdominal area)  for health and prevention.    Time 12   Period Weeks   Status On-going     PT LONG TERM GOAL #5   Title Pt will demo  increased low trap L strength 3/5 to  4/5 to return to bowling with less risk for injuries ( HEP targetting balance of L/R mm strength)    Time 12   Period Weeks   Status On-going     PT LONG TERM GOAL #6   Title Pt will demo increased R hip strength from 3/5 to > 4/5 in order to prepare for biking with minimized injuries.   Time 12   Period Weeks   Status New     PT LONG TERM GOAL #7   Title Pt will demo improved gait mechanics (wider BOS, less pronation in stance) without cuing in order to improve gait speed and engagement of proper mm    Time 12   Period Weeks   Status New                  Plan - 08/30/16 0920    Clinical Impression Statement Pt continues to show a symmetrically aligned pelvis and states her R hip pain occurs less frequently and mostly with long periods of sitting. Pt presents today with L metatarsal pain. Pt reported her pain decreased after gait training. Pt enjoys walking 3 miles per day without fatigue. PT anticipates gait training and R hip strengthening exercise will help address her lower kinetic chain issues.   PT continues to withhold manual Tx and has advised pt to withhold abdominal massage. Future sessions will focus on therapeutic ex and activities for strength and training for her bike trip that will take place in August. Energy conservation will be put into consideration as pt starts bicycling 15 min daily and gradually increasing her mileage and duration.   Pt will be seeing her [facility] MD this  month. She reported  her recent PET scan showed areas of active CA in her abdominal cavity and lymph nodes. Her report noted a midpelvis mass and enlarged lymph nodes. Pt will be undergoing treatment injections once a month at [facility].  Pt may benefit from a referral to an oncologist nutritionist to help address her digestive Sx  which include weight gain and nausea after eating but not at the same intensity as she experienced prior to the removal of her tumors. With pt's interest in returning to fitness, it will be important to ensure proper nutrition intake for the calorie expenditure with increased physical activity.     Rehab Potential Good   Clinical Impairments Affecting Rehab Potential multiple abdominal surgeries, chronicity of Sx,  recent Dx of CA with surgerical removal of tumors at liver and pelvic lymph nodes   PT Frequency 1x / week   PT Duration 12 weeks   PT Treatment/Interventions ADLs/Self Care Home Management;Aquatic Therapy;Biofeedback;Electrical Stimulation;Cryotherapy;Gait training;Moist Heat;Stair training;Functional mobility training;Therapeutic activities;Therapeutic exercise;Balance training;Neuromuscular re-education;Manual techniques;Energy conservation;Dry needling;Scar mobilization;Patient/family education;Manual lymph drainage      Patient will benefit from skilled therapeutic intervention in order to improve the following deficits and impairments:  Increased fascial restricitons, Improper body mechanics, Pain, Decreased scar mobility, Increased muscle spasms, Postural dysfunction, Decreased endurance, Decreased range of motion, Decreased strength, Impaired flexibility, Decreased mobility, Decreased coordination, Decreased activity tolerance  Visit Diagnosis: No diagnosis found.     Problem List Patient Active Problem List   Diagnosis Date Noted  . Chronic constipation 07/29/2015  . Dry eye syndrome 06/08/2015  . Acid reflux 06/08/2015  . Arthritis, degenerative 06/08/2015  .  Allergic rhinitis, seasonal 06/08/2015  . Gastrointestinal bleeding, upper 06/08/2015  . IBS (irritable bowel syndrome) 06/08/2015  . BP (high blood pressure) 02/23/2015  . Hyperparathyroidism ([place]) 02/09/2015    Jerl Mina ,PT, DPT, E-RYT  08/30/2016, 2:44 PM  [place] MAIN [facility] SERVICES [address] [place], [place], [address] Phone: [phone]   Fax:  [phone]  Name: Jessica Bradshaw MRN: [id number] Date of Birth: 17-Aug-1960

## 2016-09-07 DIAGNOSIS — D3A8 Other benign neuroendocrine tumors: Secondary | ICD-10-CM | POA: Diagnosis not present

## 2016-09-07 DIAGNOSIS — C7A8 Other malignant neuroendocrine tumors: Secondary | ICD-10-CM | POA: Diagnosis not present

## 2016-09-07 DIAGNOSIS — C7B8 Other secondary neuroendocrine tumors: Secondary | ICD-10-CM | POA: Diagnosis not present

## 2016-09-12 ENCOUNTER — Ambulatory Visit: Payer: BLUE CROSS/BLUE SHIELD | Admitting: Physical Therapy

## 2016-09-13 ENCOUNTER — Encounter: Payer: BLUE CROSS/BLUE SHIELD | Admitting: Physical Therapy

## 2016-09-14 ENCOUNTER — Ambulatory Visit: Payer: BLUE CROSS/BLUE SHIELD | Admitting: Physical Therapy

## 2016-09-14 DIAGNOSIS — M533 Sacrococcygeal disorders, not elsewhere classified: Secondary | ICD-10-CM | POA: Diagnosis not present

## 2016-09-14 DIAGNOSIS — R293 Abnormal posture: Secondary | ICD-10-CM | POA: Diagnosis not present

## 2016-09-14 DIAGNOSIS — M6281 Muscle weakness (generalized): Secondary | ICD-10-CM

## 2016-09-14 DIAGNOSIS — R279 Unspecified lack of coordination: Secondary | ICD-10-CM

## 2016-09-14 DIAGNOSIS — M25551 Pain in right hip: Secondary | ICD-10-CM | POA: Diagnosis not present

## 2016-09-14 DIAGNOSIS — M25572 Pain in left ankle and joints of left foot: Secondary | ICD-10-CM | POA: Diagnosis not present

## 2016-09-14 DIAGNOSIS — R2689 Other abnormalities of gait and mobility: Secondary | ICD-10-CM | POA: Diagnosis not present

## 2016-09-14 NOTE — Patient Instructions (Addendum)
Bridging series w/ resistive band other side of doorknob:  Level 1:  Position:  Elbows bent, knees hip width apart, heels under knees   Stabilization points: shoulders, upper arms, back of head pressed into floor. Heel press downward.   Movement: inhale do nothing, exhale pull band by side, lower fists to floor completely while lifting hips.Keep stabilization points engaged when you allow the band to go back to starting position  10 x 3 reps       Level 2:  Position:  Elbows straight, arms raised to ceiling at shoulder height, knees apart like a ballerina,heels together, heels under knees, shinbone perpendicular to ground   Stabilization points: shoulders, upper arms, back of head pressed into floor. Heel press downward.   Movement: inhale do nothing, exhale pull band by side, lower fists to floor completely while lifting hips. Keep stabilization points engaged when you allow the band to go back to starting position   10  X reps  Shoulder training: Try to imagine you are squeezing a pencil under your armpit and your shoulder blades are down away from your ears and towards each other     _______   Crab crawling  With granddaughter    ________ tricep dips over chair 5 reps    ________  Yoga: chatarunga: "hug pencil under armpits"    To stretch forearms to be able to use ballmounds in palms with floor yoga poses, push ups, etc  Forearm, wrists, and finger stretches after gripping handle bars  1)  Elbows straight,thumbs down, cross wrists over Bring knuckles by your chest and end with them under chin  Reverse 5 reps  And switch to the other side   2)  Start with prayer palm at the chest , fingers spread Draw thumbs to nose level, bringing forearms touching Bend wrists with fingers spread like you are holding a bowl of water  Brings wrists down to heart level, palms, fingers touch again  while shoulders squeeze down and back elbows, forearms move away from  each other again   5 reps    3)  Elbow crease to the sky, fingertips down, other hand gently pulls fingers back towards you  Switch hands  5 reps

## 2016-09-14 NOTE — Therapy (Signed)
[place] MAIN [facility] SERVICES [address] [place], [place], [address] Phone: [phone]   Fax:  [phone]  Physical Therapy Treatment  Patient Details  Name: Jessica Bradshaw MRN: [id number] Date of Birth: 1960/11/09 No Data Recorded  Encounter Date: 09/14/2016      PT End of Session - 09/14/16 2235    Visit Number 4   Number of Visits 12   PT Start Time 0932   PT Stop Time 1700   PT Time Calculation (min) 55 min   Activity Tolerance Patient tolerated treatment well;No increased pain   Behavior During Therapy WFL for tasks assessed/performed      Past Medical History:  Diagnosis Date  . Allergy   . Cancer [facility])    Neuroendocrine CA (recent diagnosis)  . Hypertension     Past Surgical History:  Procedure Laterality Date  . ABDOMINAL HYSTERECTOMY  2007  . CESAREAN SECTION  1985  . HIATAL HERNIA REPAIR  2001  . TONSILLECTOMY    . transoral incisionless fundoplication  6712    There were no vitals filed for this visit.      Subjective Assessment - 09/14/16 2226    Subjective Pt reported her foot pain has improved since she changed out her old tennis shoes for newer ones. There is minor pain located more along the top of arch instead of the ballmound of her first toe. Pt's hip has not bothered her. Pt has started to bike 3 miles and walk 3 miles each day and started practicing a yoga for cyclists on a video.  Pt reports she has begun her monthly cancer Tx which involve getting injections. Pt has had no major side effects.    Pertinent History Hx of fibroids, leading hysterectomy (abdominal). also TIF, and emergency C-section. SIBO 3x with antibiotics Tx in the past. 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  Pt injuried her neck (herniated disk) with lifting son.    Patient Stated Goals return to being able to active without pain (walking, treadmilling, gym classes)                           [place] Adult PT Treatment/Exercise - 09/14/16 2233      Therapeutic Activites    Therapeutic Activities --  see pt instructions     Neuro Re-ed    Neuro Re-ed Details  alignment for stability in new HEP, scapular control, education on principles of exercise to comliment cycling and to ensure strengthening of underused muscles when cycling/walking                      PT Long Term Goals - 09/14/16 2237      PT LONG TERM GOAL #1   Title Pt will demo no pelvic obliquities in order to decrease hip pain to return to exercises   Time 12   Period Weeks   Status Achieved     PT LONG TERM GOAL #2   Title Pt will report no foot pain across 2 weeks in order to increase distance in  walking routine    Time 12   Period Weeks   Status Achieved     PT LONG TERM GOAL #3   Title Pt will demo proper alignment techniques with yoga poses in order to minimize injuries and SIJ malalignment.   Time 12   Period  Weeks   Status Partially Met     PT LONG TERM GOAL #4   Title Pt will be IND with exercise routine (aerobic and strength without placing strain on abdominal area)  for health and prevention.    Time 12   Period Weeks   Status On-going     PT LONG TERM GOAL #5   Title Pt will demo  increased low trap L strength 3/5 to  4/5 to return to bowling with less risk for injuries ( HEP targetting balance of L/R mm strength)    Time 12   Period Weeks   Status On-going     PT LONG TERM GOAL #6   Title Pt will demo increased R hip strength from 3/5 to > 4/5 in order to prepare for biking with minimized injuries.   Time 12   Period Weeks   Status On-going     PT LONG TERM GOAL #7   Title Pt will demo improved gait mechanics (wider BOS, less pronation in stance) without cuing in order to improve gait speed and engagement of proper mm    Time 12   Period Weeks   Status Achieved               Plan - 09/14/16 2235    Clinical  Impression Statement Pt continues to progress well with signficantly less hip and foot pain. Progressed pt to thoracolumbar strengthening and exercises to strengthen upper body posterior mm to counterbalance cycling which she has started to do to train for a long cycling trip in August. Pt required neuromuscular reeducation to ensure proper alignment and coordination with new HEP, progressions, and yoga positions. Pt complained of wrist pain in quadriped position and forearm activation with bridging band exercises. Modifications were made to decrease tightness in forearms and decrease wrist pain.  Pt continues to benefit from skilled PT.    Rehab Potential Good   Clinical Impairments Affecting Rehab Potential multiple abdominal surgeries, chronicity of Sx,  recent Dx of CA with surgerical removal of tumors at liver and pelvic lymph nodes   PT Frequency 1x / week   PT Duration 12 weeks   PT Treatment/Interventions ADLs/Self Care Home Management;Aquatic Therapy;Biofeedback;Electrical Stimulation;Cryotherapy;Gait training;Moist Heat;Stair training;Functional mobility training;Therapeutic activities;Therapeutic exercise;Balance training;Neuromuscular re-education;Manual techniques;Energy conservation;Dry needling;Scar mobilization;Patient/family education;Manual lymph drainage      Patient will benefit from skilled therapeutic intervention in order to improve the following deficits and impairments:  Increased fascial restricitons, Improper body mechanics, Pain, Decreased scar mobility, Increased muscle spasms, Postural dysfunction, Decreased endurance, Decreased range of motion, Decreased strength, Impaired flexibility, Decreased mobility, Decreased coordination, Decreased activity tolerance  Visit Diagnosis: Pain in right hip  Sacrococcygeal disorders, not elsewhere classified  Other abnormalities of gait and mobility  Unspecified lack of coordination  Abnormal posture  Muscle weakness  (generalized)  Pain in left ankle and joints of left foot     Problem List Patient Active Problem List   Diagnosis Date Noted  . Chronic constipation 07/29/2015  . Dry eye syndrome 06/08/2015  . Acid reflux 06/08/2015  . Arthritis, degenerative 06/08/2015  . Allergic rhinitis, seasonal 06/08/2015  . Gastrointestinal bleeding, upper 06/08/2015  . IBS (irritable bowel syndrome) 06/08/2015  . BP (high blood pressure) 02/23/2015  . Hyperparathyroidism ([place]) 02/09/2015    Jerl Mina ,PT, DPT, E-RYT  09/14/2016, 10:38 PM  [place] MAIN [facility] SERVICES [address] [place], [place], [address] Phone: [phone]   Fax:  [phone]  Name: Jessica Benedick  Bradshaw MRN: [id number] Date of Birth: 02/28/1961

## 2016-10-04 DIAGNOSIS — M5386 Other specified dorsopathies, lumbar region: Secondary | ICD-10-CM | POA: Diagnosis not present

## 2016-10-04 DIAGNOSIS — M531 Cervicobrachial syndrome: Secondary | ICD-10-CM | POA: Diagnosis not present

## 2016-10-05 ENCOUNTER — Encounter: Payer: BLUE CROSS/BLUE SHIELD | Admitting: Physical Therapy

## 2016-10-05 DIAGNOSIS — C7B8 Other secondary neuroendocrine tumors: Secondary | ICD-10-CM | POA: Diagnosis not present

## 2016-10-05 DIAGNOSIS — E611 Iron deficiency: Secondary | ICD-10-CM | POA: Diagnosis not present

## 2016-10-05 DIAGNOSIS — C7A8 Other malignant neuroendocrine tumors: Secondary | ICD-10-CM | POA: Diagnosis not present

## 2016-10-05 DIAGNOSIS — D3A8 Other benign neuroendocrine tumors: Secondary | ICD-10-CM | POA: Diagnosis not present

## 2016-10-15 DIAGNOSIS — N10 Acute tubulo-interstitial nephritis: Secondary | ICD-10-CM | POA: Diagnosis not present

## 2016-10-15 DIAGNOSIS — R300 Dysuria: Secondary | ICD-10-CM | POA: Diagnosis not present

## 2016-10-15 DIAGNOSIS — M545 Low back pain: Secondary | ICD-10-CM | POA: Diagnosis not present

## 2016-10-15 DIAGNOSIS — Z90710 Acquired absence of both cervix and uterus: Secondary | ICD-10-CM | POA: Diagnosis not present

## 2016-10-15 DIAGNOSIS — Z888 Allergy status to other drugs, medicaments and biological substances status: Secondary | ICD-10-CM | POA: Diagnosis not present

## 2016-10-15 DIAGNOSIS — R1031 Right lower quadrant pain: Secondary | ICD-10-CM | POA: Diagnosis not present

## 2016-10-15 DIAGNOSIS — R10811 Right upper quadrant abdominal tenderness: Secondary | ICD-10-CM | POA: Diagnosis not present

## 2016-10-15 DIAGNOSIS — Z9049 Acquired absence of other specified parts of digestive tract: Secondary | ICD-10-CM | POA: Diagnosis not present

## 2016-10-15 DIAGNOSIS — R102 Pelvic and perineal pain: Secondary | ICD-10-CM | POA: Diagnosis not present

## 2016-10-15 DIAGNOSIS — D3A8 Other benign neuroendocrine tumors: Secondary | ICD-10-CM | POA: Diagnosis not present

## 2016-10-15 DIAGNOSIS — I10 Essential (primary) hypertension: Secondary | ICD-10-CM | POA: Diagnosis not present

## 2016-10-15 DIAGNOSIS — R10813 Right lower quadrant abdominal tenderness: Secondary | ICD-10-CM | POA: Diagnosis not present

## 2016-10-15 DIAGNOSIS — N390 Urinary tract infection, site not specified: Secondary | ICD-10-CM | POA: Diagnosis not present

## 2016-10-15 DIAGNOSIS — R918 Other nonspecific abnormal finding of lung field: Secondary | ICD-10-CM | POA: Diagnosis not present

## 2016-10-15 DIAGNOSIS — R350 Frequency of micturition: Secondary | ICD-10-CM | POA: Diagnosis not present

## 2016-10-16 DIAGNOSIS — R6889 Other general symptoms and signs: Secondary | ICD-10-CM | POA: Diagnosis not present

## 2016-10-16 DIAGNOSIS — N390 Urinary tract infection, site not specified: Secondary | ICD-10-CM | POA: Diagnosis not present

## 2016-10-16 DIAGNOSIS — R1031 Right lower quadrant pain: Secondary | ICD-10-CM | POA: Diagnosis not present

## 2016-10-16 DIAGNOSIS — R918 Other nonspecific abnormal finding of lung field: Secondary | ICD-10-CM | POA: Diagnosis not present

## 2016-10-16 DIAGNOSIS — C7A8 Other malignant neuroendocrine tumors: Secondary | ICD-10-CM | POA: Diagnosis not present

## 2016-10-16 DIAGNOSIS — N10 Acute tubulo-interstitial nephritis: Secondary | ICD-10-CM | POA: Diagnosis not present

## 2016-10-21 ENCOUNTER — Ambulatory Visit: Payer: BLUE CROSS/BLUE SHIELD | Admitting: Physical Therapy

## 2016-11-01 DIAGNOSIS — M531 Cervicobrachial syndrome: Secondary | ICD-10-CM | POA: Diagnosis not present

## 2016-11-01 DIAGNOSIS — M9902 Segmental and somatic dysfunction of thoracic region: Secondary | ICD-10-CM | POA: Diagnosis not present

## 2016-11-01 DIAGNOSIS — M9901 Segmental and somatic dysfunction of cervical region: Secondary | ICD-10-CM | POA: Diagnosis not present

## 2016-11-07 ENCOUNTER — Ambulatory Visit: Payer: BLUE CROSS/BLUE SHIELD | Admitting: Physical Therapy

## 2016-11-09 DIAGNOSIS — D3A8 Other benign neuroendocrine tumors: Secondary | ICD-10-CM | POA: Diagnosis not present

## 2016-11-18 DIAGNOSIS — M531 Cervicobrachial syndrome: Secondary | ICD-10-CM | POA: Diagnosis not present

## 2016-11-18 DIAGNOSIS — M9901 Segmental and somatic dysfunction of cervical region: Secondary | ICD-10-CM | POA: Diagnosis not present

## 2016-11-18 DIAGNOSIS — M9902 Segmental and somatic dysfunction of thoracic region: Secondary | ICD-10-CM | POA: Diagnosis not present

## 2016-11-25 ENCOUNTER — Ambulatory Visit: Payer: BLUE CROSS/BLUE SHIELD | Attending: Internal Medicine | Admitting: Physical Therapy

## 2016-11-25 DIAGNOSIS — K909 Intestinal malabsorption, unspecified: Secondary | ICD-10-CM | POA: Diagnosis not present

## 2016-11-25 DIAGNOSIS — M6281 Muscle weakness (generalized): Secondary | ICD-10-CM | POA: Insufficient documentation

## 2016-11-25 DIAGNOSIS — R2689 Other abnormalities of gait and mobility: Secondary | ICD-10-CM | POA: Diagnosis not present

## 2016-11-25 DIAGNOSIS — M25572 Pain in left ankle and joints of left foot: Secondary | ICD-10-CM | POA: Insufficient documentation

## 2016-11-25 DIAGNOSIS — R279 Unspecified lack of coordination: Secondary | ICD-10-CM | POA: Diagnosis not present

## 2016-11-25 DIAGNOSIS — R293 Abnormal posture: Secondary | ICD-10-CM

## 2016-11-25 DIAGNOSIS — M533 Sacrococcygeal disorders, not elsewhere classified: Secondary | ICD-10-CM | POA: Diagnosis not present

## 2016-11-25 DIAGNOSIS — M25551 Pain in right hip: Secondary | ICD-10-CM

## 2016-11-25 NOTE — Therapy (Addendum)
[place] MAIN [facility] SERVICES [address] [place], [place], [address] Phone: [phone]   Fax:  [phone]  Physical Therapy Treatment  Patient Details  Name: Jessica Bradshaw MRN: [id number] Date of Birth: 1960/04/29 No Data Recorded  Encounter Date: 11/25/2016      PT End of Session - 11/25/16 1015    Visit Number 5   Number of Visits 12   PT Start Time 0910   PT Stop Time 1000   PT Time Calculation (min) 50 min   Activity Tolerance Patient tolerated treatment well;No increased pain   Behavior During Therapy WFL for tasks assessed/performed      Past Medical History:  Diagnosis Date  . Allergy   . Cancer [facility])    Neuroendocrine CA (recent diagnosis)  . Hypertension     Past Surgical History:  Procedure Laterality Date  . ABDOMINAL HYSTERECTOMY  2007  . CESAREAN SECTION  1985  . HIATAL HERNIA REPAIR  2001  . TONSILLECTOMY    . transoral incisionless fundoplication  7517    There were no vitals filed for this visit.      Subjective Assessment - 11/25/16 0911    Subjective Pt went to the hospital for UTI treatment and received IV and oral antibiotics in June. SInce then, pt felt she has not recovered from it. Pt has felt neck. low back pain which improved with chiropractic Tx. Pt has had vomitting ( last 2 weeks) and nausea "morning sickness"  ( occuring all along)  with bowel movements . Pt has dropped off stool samples to Dr. Reynaldo Minium. Pt notices the R hip pain that has been present prior to CA Hx is still bothering here on and off     Pertinent History Hx of fibroids, leading hysterectomy (abdominal). also TIF, and emergency C-section. SIBO 3x with antibiotics Tx in the past. 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  Pt injuried her neck (herniated disk) with lifting son.    Patient Stated Goals return to being able to active without pain (walking, treadmilling,  gym classes)             [facility] PT Assessment - 11/25/16 1011      PROM   Overall PROM Comments hip flex/ ER at pain, post Tx no pain      Strength   Overall Strength Comments 5/5 B hip abd, low trap 5/5       Palpation   SI assessment  slightly more anterior  R ASIS    Palpation comment muscle tightness/ tenderness at glut med atachment by anterior iliac crest ( decreased post Tx)                      [facility] Adult PT Treatment/Exercise - 11/25/16 1014      Manual Therapy   Manual therapy comments rotation mob, MWM/ STM at glut med ( anterior attachment)                 PT Education - 11/25/16 1015    Education provided Yes   Education Details HEP   Person(s) Educated Patient   Methods Explanation;Demonstration;Tactile cues;Verbal cues;Handout   Comprehension Returned demonstration;Verbalized understanding             PT Long Term Goals - 11/25/16 0942      PT LONG TERM GOAL #1   Title Pt will demo no pelvic obliquities  in order to decrease hip pain to return to exercises   Time 12   Period Weeks   Status Achieved     PT LONG TERM GOAL #2   Title Pt will report no foot pain across 2 weeks in order to increase distance in  walking routine    Time 12   Period Weeks   Status Achieved     PT LONG TERM GOAL #3   Title Pt will demo proper alignment techniques with yoga poses in order to minimize injuries and SIJ malalignment.   Time 12   Period Weeks   Status Achieved     PT LONG TERM GOAL #4   Title Pt will be IND with exercise routine (aerobic and strength without placing strain on abdominal area)  for health and prevention.    Time 12   Period Weeks   Status Achieved     PT LONG TERM GOAL #5   Title Pt will demo  increased low trap L strength 3/5 to  4/5 to return to bowling with less risk for injuries ( HEP targetting balance of L/R mm strength)    Time 12   Period Weeks   Status Achieved     PT LONG TERM GOAL #6   Title Pt  will demo increased R hip strength from 3/5 to > 4/5 in order to prepare for biking with minimized injuries.   Time 12   Period Weeks   Status Achieved     PT LONG TERM GOAL #7   Title Pt will demo improved gait mechanics (wider BOS, less pronation in stance) without cuing in order to improve gait speed and engagement of proper mm    Time 12   Period Weeks   Status Achieved     PT LONG TERM GOAL #8   Title Pt will be IND with post-biking recovery and self-management of muscle / joint aches from riding long hours.     Time 12   Period Weeks   Status New               Plan - 11/25/16 1728    Clinical Impression Statement Pt's pelvic obliquity was minor and likely associated with her R hip pain. Addressed increased mm tightness at anterior attachment of glut medius mm with manual Tx. Pt reported less R hip pain in hip flex/ER and with palpation following Tx. Reviewed stretches for biking as pt prepares for her biking trip this month. Pt to return in a month and will be ready for d/c.   Pt was referred to psychotherapy services as pt reported feeling depressed about her Dx of CA. Pt voiced receptivity to seek psychotherapy. Pt is undergoing tests re: the nausea and vomiting she continues to have after bowel movements. Pt continues to be under the care of MD.      Rehab Potential Good   Clinical Impairments Affecting Rehab Potential multiple abdominal surgeries, chronicity of Sx,  recent Dx of CA with surgerical removal of tumors at liver and pelvic lymph nodes   PT Frequency 1x / week   PT Duration 12 weeks   PT Treatment/Interventions ADLs/Self Care Home Management;Aquatic Therapy;Biofeedback;Electrical Stimulation;Cryotherapy;Gait training;Moist Heat;Stair training;Functional mobility training;Therapeutic activities;Therapeutic exercise;Balance training;Neuromuscular re-education;Manual techniques;Energy conservation;Dry needling;Scar mobilization;Patient/family education;Manual lymph  drainage      Patient will benefit from skilled therapeutic intervention in order to improve the following deficits and impairments:  Increased fascial restricitons, Improper body mechanics, Pain, Decreased scar mobility, Increased muscle spasms, Postural dysfunction,  Decreased endurance, Decreased range of motion, Decreased strength, Impaired flexibility, Decreased mobility, Decreased coordination, Decreased activity tolerance  Visit Diagnosis: Pain in right hip  Sacrococcygeal disorders, not elsewhere classified  Other abnormalities of gait and mobility  Unspecified lack of coordination  Abnormal posture  Muscle weakness (generalized)  Pain in left ankle and joints of left foot     Problem List Patient Active Problem List   Diagnosis Date Noted  . Chronic constipation 07/29/2015  . Dry eye syndrome 06/08/2015  . Acid reflux 06/08/2015  . Arthritis, degenerative 06/08/2015  . Allergic rhinitis, seasonal 06/08/2015  . Gastrointestinal bleeding, upper 06/08/2015  . IBS (irritable bowel syndrome) 06/08/2015  . BP (high blood pressure) 02/23/2015  . Hyperparathyroidism ([place]) 02/09/2015    Jerl Mina ,PT, DPT, E-RYT  11/25/2016, 5:30 PM  [place] MAIN [facility] SERVICES [address] [place], [place], [address] Phone: [phone]   Fax:  [phone]  Name: Jessica Bradshaw MRN: [id number] Date of Birth: 03-04-1961

## 2016-11-25 NOTE — Patient Instructions (Addendum)
Stretches  Chair yoga with arms behind back  Eagle pose ( same arm/leg over)   Chair pose again   Lunge ( feet hip width apart when stepping back) with twist ( keeping hips squared)     ______________ Post- biking self-care:   Epsom salt baths, self massage/ moisturizer with coconut oil   For post biking    ______________  To release glut medius R   Half kneeling lunge, R knee back, rocking back and forth 5rep   Pressure below the crest While moving toes in/out, knee straight, or knees bent, rocking knee out and out and heel slide  5 reps each direction   _________________  Jessica Bradshaw at [employer] for counseling [place] [email or web address]

## 2016-12-05 DIAGNOSIS — D3A8 Other benign neuroendocrine tumors: Secondary | ICD-10-CM | POA: Diagnosis not present

## 2016-12-15 DIAGNOSIS — L812 Freckles: Secondary | ICD-10-CM | POA: Diagnosis not present

## 2016-12-15 DIAGNOSIS — D485 Neoplasm of uncertain behavior of skin: Secondary | ICD-10-CM | POA: Diagnosis not present

## 2016-12-15 DIAGNOSIS — D225 Melanocytic nevi of trunk: Secondary | ICD-10-CM | POA: Diagnosis not present

## 2016-12-15 DIAGNOSIS — L719 Rosacea, unspecified: Secondary | ICD-10-CM | POA: Diagnosis not present

## 2016-12-15 DIAGNOSIS — D1800 Hemangioma unspecified site: Secondary | ICD-10-CM | POA: Diagnosis not present

## 2016-12-15 DIAGNOSIS — Z1283 Encounter for screening for malignant neoplasm of skin: Secondary | ICD-10-CM | POA: Diagnosis not present

## 2016-12-19 DIAGNOSIS — M9901 Segmental and somatic dysfunction of cervical region: Secondary | ICD-10-CM | POA: Diagnosis not present

## 2016-12-19 DIAGNOSIS — M436 Torticollis: Secondary | ICD-10-CM | POA: Diagnosis not present

## 2016-12-19 DIAGNOSIS — M531 Cervicobrachial syndrome: Secondary | ICD-10-CM | POA: Diagnosis not present

## 2016-12-22 ENCOUNTER — Other Ambulatory Visit: Payer: Self-pay | Admitting: Physician Assistant

## 2016-12-22 DIAGNOSIS — R109 Unspecified abdominal pain: Secondary | ICD-10-CM | POA: Diagnosis not present

## 2016-12-22 DIAGNOSIS — Z90710 Acquired absence of both cervix and uterus: Secondary | ICD-10-CM | POA: Diagnosis not present

## 2016-12-22 DIAGNOSIS — I10 Essential (primary) hypertension: Secondary | ICD-10-CM

## 2016-12-22 DIAGNOSIS — G893 Neoplasm related pain (acute) (chronic): Secondary | ICD-10-CM | POA: Diagnosis not present

## 2016-12-22 DIAGNOSIS — R918 Other nonspecific abnormal finding of lung field: Secondary | ICD-10-CM | POA: Diagnosis not present

## 2016-12-22 DIAGNOSIS — C7A8 Other malignant neuroendocrine tumors: Secondary | ICD-10-CM | POA: Diagnosis not present

## 2016-12-22 DIAGNOSIS — D3A8 Other benign neuroendocrine tumors: Secondary | ICD-10-CM | POA: Diagnosis not present

## 2016-12-22 DIAGNOSIS — C7B8 Other secondary neuroendocrine tumors: Secondary | ICD-10-CM | POA: Diagnosis not present

## 2016-12-22 DIAGNOSIS — Z9049 Acquired absence of other specified parts of digestive tract: Secondary | ICD-10-CM | POA: Diagnosis not present

## 2016-12-22 DIAGNOSIS — F329 Major depressive disorder, single episode, unspecified: Secondary | ICD-10-CM | POA: Diagnosis not present

## 2016-12-22 DIAGNOSIS — D509 Iron deficiency anemia, unspecified: Secondary | ICD-10-CM | POA: Diagnosis not present

## 2016-12-22 DIAGNOSIS — F4323 Adjustment disorder with mixed anxiety and depressed mood: Secondary | ICD-10-CM | POA: Diagnosis not present

## 2016-12-28 ENCOUNTER — Ambulatory Visit: Payer: BLUE CROSS/BLUE SHIELD | Admitting: Physical Therapy

## 2016-12-29 DIAGNOSIS — F329 Major depressive disorder, single episode, unspecified: Secondary | ICD-10-CM | POA: Diagnosis not present

## 2016-12-29 DIAGNOSIS — F419 Anxiety disorder, unspecified: Secondary | ICD-10-CM | POA: Diagnosis not present

## 2016-12-29 DIAGNOSIS — I10 Essential (primary) hypertension: Secondary | ICD-10-CM | POA: Diagnosis not present

## 2017-01-03 DIAGNOSIS — M79602 Pain in left arm: Secondary | ICD-10-CM | POA: Diagnosis not present

## 2017-01-03 DIAGNOSIS — M7989 Other specified soft tissue disorders: Secondary | ICD-10-CM | POA: Diagnosis not present

## 2017-01-03 DIAGNOSIS — S59912A Unspecified injury of left forearm, initial encounter: Secondary | ICD-10-CM | POA: Diagnosis not present

## 2017-01-04 DIAGNOSIS — D3A8 Other benign neuroendocrine tumors: Secondary | ICD-10-CM | POA: Diagnosis not present

## 2017-01-04 DIAGNOSIS — R109 Unspecified abdominal pain: Secondary | ICD-10-CM | POA: Diagnosis not present

## 2017-01-04 DIAGNOSIS — M25532 Pain in left wrist: Secondary | ICD-10-CM | POA: Diagnosis not present

## 2017-01-04 DIAGNOSIS — S63502A Unspecified sprain of left wrist, initial encounter: Secondary | ICD-10-CM | POA: Diagnosis not present

## 2017-01-04 DIAGNOSIS — S52125A Nondisplaced fracture of head of left radius, initial encounter for closed fracture: Secondary | ICD-10-CM | POA: Diagnosis not present

## 2017-01-17 DIAGNOSIS — M25532 Pain in left wrist: Secondary | ICD-10-CM | POA: Diagnosis not present

## 2017-01-17 DIAGNOSIS — S52125A Nondisplaced fracture of head of left radius, initial encounter for closed fracture: Secondary | ICD-10-CM | POA: Diagnosis not present

## 2017-01-18 DIAGNOSIS — M530 Cervicocranial syndrome: Secondary | ICD-10-CM | POA: Diagnosis not present

## 2017-01-18 DIAGNOSIS — M5386 Other specified dorsopathies, lumbar region: Secondary | ICD-10-CM | POA: Diagnosis not present

## 2017-01-18 DIAGNOSIS — M9901 Segmental and somatic dysfunction of cervical region: Secondary | ICD-10-CM | POA: Diagnosis not present

## 2017-01-25 DIAGNOSIS — M9901 Segmental and somatic dysfunction of cervical region: Secondary | ICD-10-CM | POA: Diagnosis not present

## 2017-01-25 DIAGNOSIS — M5386 Other specified dorsopathies, lumbar region: Secondary | ICD-10-CM | POA: Diagnosis not present

## 2017-01-25 DIAGNOSIS — M531 Cervicobrachial syndrome: Secondary | ICD-10-CM | POA: Diagnosis not present

## 2017-01-25 DIAGNOSIS — M530 Cervicocranial syndrome: Secondary | ICD-10-CM | POA: Diagnosis not present

## 2017-01-27 DIAGNOSIS — R300 Dysuria: Secondary | ICD-10-CM | POA: Diagnosis not present

## 2017-01-27 DIAGNOSIS — D3A8 Other benign neuroendocrine tumors: Secondary | ICD-10-CM | POA: Diagnosis not present

## 2017-02-06 DIAGNOSIS — M531 Cervicobrachial syndrome: Secondary | ICD-10-CM | POA: Diagnosis not present

## 2017-02-06 DIAGNOSIS — M9902 Segmental and somatic dysfunction of thoracic region: Secondary | ICD-10-CM | POA: Diagnosis not present

## 2017-02-06 DIAGNOSIS — M9901 Segmental and somatic dysfunction of cervical region: Secondary | ICD-10-CM | POA: Diagnosis not present

## 2017-02-14 DIAGNOSIS — S52125A Nondisplaced fracture of head of left radius, initial encounter for closed fracture: Secondary | ICD-10-CM | POA: Diagnosis not present

## 2017-02-14 DIAGNOSIS — M25532 Pain in left wrist: Secondary | ICD-10-CM | POA: Diagnosis not present

## 2017-02-21 DIAGNOSIS — R300 Dysuria: Secondary | ICD-10-CM | POA: Diagnosis not present

## 2017-02-21 DIAGNOSIS — R3129 Other microscopic hematuria: Secondary | ICD-10-CM | POA: Diagnosis not present

## 2017-02-22 DIAGNOSIS — L918 Other hypertrophic disorders of the skin: Secondary | ICD-10-CM | POA: Diagnosis not present

## 2017-02-24 DIAGNOSIS — D3A8 Other benign neuroendocrine tumors: Secondary | ICD-10-CM | POA: Diagnosis not present

## 2017-02-27 DIAGNOSIS — F329 Major depressive disorder, single episode, unspecified: Secondary | ICD-10-CM | POA: Diagnosis not present

## 2017-02-27 DIAGNOSIS — C7B8 Other secondary neuroendocrine tumors: Secondary | ICD-10-CM | POA: Diagnosis not present

## 2017-02-27 DIAGNOSIS — R109 Unspecified abdominal pain: Secondary | ICD-10-CM | POA: Diagnosis not present

## 2017-02-27 DIAGNOSIS — R300 Dysuria: Secondary | ICD-10-CM | POA: Diagnosis not present

## 2017-02-27 DIAGNOSIS — D3A8 Other benign neuroendocrine tumors: Secondary | ICD-10-CM | POA: Diagnosis not present

## 2017-02-27 DIAGNOSIS — N390 Urinary tract infection, site not specified: Secondary | ICD-10-CM | POA: Diagnosis not present

## 2017-02-27 DIAGNOSIS — C7A8 Other malignant neuroendocrine tumors: Secondary | ICD-10-CM | POA: Diagnosis not present

## 2017-03-27 DIAGNOSIS — M5386 Other specified dorsopathies, lumbar region: Secondary | ICD-10-CM | POA: Diagnosis not present

## 2017-03-27 DIAGNOSIS — M9903 Segmental and somatic dysfunction of lumbar region: Secondary | ICD-10-CM | POA: Diagnosis not present

## 2017-03-27 DIAGNOSIS — M9902 Segmental and somatic dysfunction of thoracic region: Secondary | ICD-10-CM | POA: Diagnosis not present

## 2017-03-29 DIAGNOSIS — D3A8 Other benign neuroendocrine tumors: Secondary | ICD-10-CM | POA: Diagnosis not present

## 2017-04-11 DIAGNOSIS — R300 Dysuria: Secondary | ICD-10-CM | POA: Diagnosis not present

## 2017-04-14 DIAGNOSIS — M5386 Other specified dorsopathies, lumbar region: Secondary | ICD-10-CM | POA: Diagnosis not present

## 2017-04-14 DIAGNOSIS — M531 Cervicobrachial syndrome: Secondary | ICD-10-CM | POA: Diagnosis not present

## 2017-04-14 DIAGNOSIS — M9902 Segmental and somatic dysfunction of thoracic region: Secondary | ICD-10-CM | POA: Diagnosis not present

## 2017-04-24 DIAGNOSIS — M9902 Segmental and somatic dysfunction of thoracic region: Secondary | ICD-10-CM | POA: Diagnosis not present

## 2017-04-24 DIAGNOSIS — M531 Cervicobrachial syndrome: Secondary | ICD-10-CM | POA: Diagnosis not present

## 2017-04-24 DIAGNOSIS — M5386 Other specified dorsopathies, lumbar region: Secondary | ICD-10-CM | POA: Diagnosis not present

## 2017-05-03 DIAGNOSIS — D3A8 Other benign neuroendocrine tumors: Secondary | ICD-10-CM | POA: Diagnosis not present

## 2017-05-23 DIAGNOSIS — N9089 Other specified noninflammatory disorders of vulva and perineum: Secondary | ICD-10-CM | POA: Diagnosis not present

## 2017-05-23 DIAGNOSIS — R399 Unspecified symptoms and signs involving the genitourinary system: Secondary | ICD-10-CM | POA: Diagnosis not present

## 2017-05-23 DIAGNOSIS — N3941 Urge incontinence: Secondary | ICD-10-CM | POA: Diagnosis not present

## 2017-05-24 DIAGNOSIS — M9902 Segmental and somatic dysfunction of thoracic region: Secondary | ICD-10-CM | POA: Diagnosis not present

## 2017-05-24 DIAGNOSIS — M9903 Segmental and somatic dysfunction of lumbar region: Secondary | ICD-10-CM | POA: Diagnosis not present

## 2017-05-24 DIAGNOSIS — M531 Cervicobrachial syndrome: Secondary | ICD-10-CM | POA: Diagnosis not present

## 2017-05-29 DIAGNOSIS — Z9889 Other specified postprocedural states: Secondary | ICD-10-CM | POA: Diagnosis not present

## 2017-05-29 DIAGNOSIS — R300 Dysuria: Secondary | ICD-10-CM | POA: Diagnosis not present

## 2017-05-29 DIAGNOSIS — C7B8 Other secondary neuroendocrine tumors: Secondary | ICD-10-CM | POA: Diagnosis not present

## 2017-05-29 DIAGNOSIS — R109 Unspecified abdominal pain: Secondary | ICD-10-CM | POA: Diagnosis not present

## 2017-05-29 DIAGNOSIS — D3A8 Other benign neuroendocrine tumors: Secondary | ICD-10-CM | POA: Diagnosis not present

## 2017-05-29 DIAGNOSIS — R918 Other nonspecific abnormal finding of lung field: Secondary | ICD-10-CM | POA: Diagnosis not present

## 2017-05-29 DIAGNOSIS — K8689 Other specified diseases of pancreas: Secondary | ICD-10-CM | POA: Diagnosis not present

## 2017-05-29 DIAGNOSIS — F418 Other specified anxiety disorders: Secondary | ICD-10-CM | POA: Diagnosis not present

## 2017-05-29 DIAGNOSIS — Z79899 Other long term (current) drug therapy: Secondary | ICD-10-CM | POA: Diagnosis not present

## 2017-05-29 DIAGNOSIS — R232 Flushing: Secondary | ICD-10-CM | POA: Diagnosis not present

## 2017-05-29 DIAGNOSIS — C7A8 Other malignant neuroendocrine tumors: Secondary | ICD-10-CM | POA: Diagnosis not present

## 2017-05-29 DIAGNOSIS — Z5181 Encounter for therapeutic drug level monitoring: Secondary | ICD-10-CM | POA: Diagnosis not present

## 2017-05-29 DIAGNOSIS — C786 Secondary malignant neoplasm of retroperitoneum and peritoneum: Secondary | ICD-10-CM | POA: Diagnosis not present

## 2017-06-14 ENCOUNTER — Other Ambulatory Visit: Payer: Self-pay | Admitting: Physician Assistant

## 2017-06-14 DIAGNOSIS — I10 Essential (primary) hypertension: Secondary | ICD-10-CM

## 2017-06-14 DIAGNOSIS — C7A8 Other malignant neuroendocrine tumors: Secondary | ICD-10-CM | POA: Diagnosis not present

## 2017-06-14 DIAGNOSIS — C7B8 Other secondary neuroendocrine tumors: Secondary | ICD-10-CM | POA: Diagnosis not present

## 2017-06-26 DIAGNOSIS — M9901 Segmental and somatic dysfunction of cervical region: Secondary | ICD-10-CM | POA: Diagnosis not present

## 2017-06-26 DIAGNOSIS — M9903 Segmental and somatic dysfunction of lumbar region: Secondary | ICD-10-CM | POA: Diagnosis not present

## 2017-06-26 DIAGNOSIS — M530 Cervicocranial syndrome: Secondary | ICD-10-CM | POA: Diagnosis not present

## 2017-06-26 DIAGNOSIS — M531 Cervicobrachial syndrome: Secondary | ICD-10-CM | POA: Diagnosis not present

## 2017-06-28 DIAGNOSIS — D3A8 Other benign neuroendocrine tumors: Secondary | ICD-10-CM | POA: Diagnosis not present

## 2017-07-13 ENCOUNTER — Other Ambulatory Visit: Payer: Self-pay | Admitting: Physician Assistant

## 2017-07-13 DIAGNOSIS — I10 Essential (primary) hypertension: Secondary | ICD-10-CM

## 2017-07-25 DIAGNOSIS — M9903 Segmental and somatic dysfunction of lumbar region: Secondary | ICD-10-CM | POA: Diagnosis not present

## 2017-07-25 DIAGNOSIS — S73101A Unspecified sprain of right hip, initial encounter: Secondary | ICD-10-CM | POA: Diagnosis not present

## 2017-07-25 DIAGNOSIS — M531 Cervicobrachial syndrome: Secondary | ICD-10-CM | POA: Diagnosis not present

## 2017-07-25 DIAGNOSIS — M9902 Segmental and somatic dysfunction of thoracic region: Secondary | ICD-10-CM | POA: Diagnosis not present

## 2017-07-26 DIAGNOSIS — I10 Essential (primary) hypertension: Secondary | ICD-10-CM | POA: Diagnosis not present

## 2017-07-26 DIAGNOSIS — F329 Major depressive disorder, single episode, unspecified: Secondary | ICD-10-CM | POA: Diagnosis not present

## 2017-07-26 DIAGNOSIS — F419 Anxiety disorder, unspecified: Secondary | ICD-10-CM | POA: Diagnosis not present

## 2017-07-26 DIAGNOSIS — D3A8 Other benign neuroendocrine tumors: Secondary | ICD-10-CM | POA: Diagnosis not present

## 2017-07-26 DIAGNOSIS — J301 Allergic rhinitis due to pollen: Secondary | ICD-10-CM | POA: Diagnosis not present

## 2017-07-28 DIAGNOSIS — M9903 Segmental and somatic dysfunction of lumbar region: Secondary | ICD-10-CM | POA: Diagnosis not present

## 2017-07-28 DIAGNOSIS — M531 Cervicobrachial syndrome: Secondary | ICD-10-CM | POA: Diagnosis not present

## 2017-07-28 DIAGNOSIS — M9902 Segmental and somatic dysfunction of thoracic region: Secondary | ICD-10-CM | POA: Diagnosis not present

## 2017-07-31 DIAGNOSIS — S73101A Unspecified sprain of right hip, initial encounter: Secondary | ICD-10-CM | POA: Diagnosis not present

## 2017-07-31 DIAGNOSIS — M9903 Segmental and somatic dysfunction of lumbar region: Secondary | ICD-10-CM | POA: Diagnosis not present

## 2017-07-31 DIAGNOSIS — M531 Cervicobrachial syndrome: Secondary | ICD-10-CM | POA: Diagnosis not present

## 2017-07-31 DIAGNOSIS — M9902 Segmental and somatic dysfunction of thoracic region: Secondary | ICD-10-CM | POA: Diagnosis not present

## 2017-08-01 DIAGNOSIS — F329 Major depressive disorder, single episode, unspecified: Secondary | ICD-10-CM | POA: Diagnosis not present

## 2017-08-01 DIAGNOSIS — M5020 Other cervical disc displacement, unspecified cervical region: Secondary | ICD-10-CM | POA: Diagnosis not present

## 2017-08-01 DIAGNOSIS — Z79899 Other long term (current) drug therapy: Secondary | ICD-10-CM | POA: Diagnosis not present

## 2017-08-01 DIAGNOSIS — K5909 Other constipation: Secondary | ICD-10-CM | POA: Diagnosis not present

## 2017-08-01 DIAGNOSIS — Z90710 Acquired absence of both cervix and uterus: Secondary | ICD-10-CM | POA: Diagnosis not present

## 2017-08-01 DIAGNOSIS — C7B8 Other secondary neuroendocrine tumors: Secondary | ICD-10-CM | POA: Diagnosis not present

## 2017-08-01 DIAGNOSIS — D649 Anemia, unspecified: Secondary | ICD-10-CM | POA: Diagnosis not present

## 2017-08-01 DIAGNOSIS — I10 Essential (primary) hypertension: Secondary | ICD-10-CM | POA: Diagnosis not present

## 2017-08-01 DIAGNOSIS — E892 Postprocedural hypoparathyroidism: Secondary | ICD-10-CM | POA: Diagnosis not present

## 2017-08-01 DIAGNOSIS — C787 Secondary malignant neoplasm of liver and intrahepatic bile duct: Secondary | ICD-10-CM | POA: Diagnosis not present

## 2017-08-01 DIAGNOSIS — C786 Secondary malignant neoplasm of retroperitoneum and peritoneum: Secondary | ICD-10-CM | POA: Diagnosis not present

## 2017-08-01 DIAGNOSIS — D3A8 Other benign neuroendocrine tumors: Secondary | ICD-10-CM | POA: Diagnosis not present

## 2017-08-01 DIAGNOSIS — C7A8 Other malignant neuroendocrine tumors: Secondary | ICD-10-CM | POA: Diagnosis not present

## 2017-08-01 DIAGNOSIS — G8918 Other acute postprocedural pain: Secondary | ICD-10-CM | POA: Diagnosis not present

## 2017-08-07 DIAGNOSIS — C7A1 Malignant poorly differentiated neuroendocrine tumors: Secondary | ICD-10-CM | POA: Diagnosis not present

## 2017-08-07 DIAGNOSIS — C7B8 Other secondary neuroendocrine tumors: Secondary | ICD-10-CM | POA: Diagnosis not present

## 2017-08-07 DIAGNOSIS — C7A8 Other malignant neuroendocrine tumors: Secondary | ICD-10-CM | POA: Diagnosis not present

## 2017-08-07 DIAGNOSIS — D3A8 Other benign neuroendocrine tumors: Secondary | ICD-10-CM | POA: Diagnosis not present

## 2017-08-23 DIAGNOSIS — C7A8 Other malignant neuroendocrine tumors: Secondary | ICD-10-CM | POA: Diagnosis not present

## 2017-08-23 DIAGNOSIS — Z483 Aftercare following surgery for neoplasm: Secondary | ICD-10-CM | POA: Diagnosis not present

## 2017-08-23 DIAGNOSIS — C7B8 Other secondary neuroendocrine tumors: Secondary | ICD-10-CM | POA: Diagnosis not present

## 2017-09-03 ENCOUNTER — Other Ambulatory Visit: Payer: Self-pay | Admitting: Physician Assistant

## 2017-09-03 DIAGNOSIS — I10 Essential (primary) hypertension: Secondary | ICD-10-CM

## 2017-09-04 DIAGNOSIS — R0602 Shortness of breath: Secondary | ICD-10-CM | POA: Diagnosis not present

## 2017-09-04 DIAGNOSIS — C801 Malignant (primary) neoplasm, unspecified: Secondary | ICD-10-CM | POA: Diagnosis not present

## 2017-09-04 NOTE — Telephone Encounter (Signed)
Patient needs appt before more refills. I have never seen her. She has seen Adriana once and Mikki Santee once since Dr. Jerilynn Mages left. Not seen in our office since 2017. May need labs.

## 2017-09-15 DIAGNOSIS — M531 Cervicobrachial syndrome: Secondary | ICD-10-CM | POA: Diagnosis not present

## 2017-09-15 DIAGNOSIS — M7701 Medial epicondylitis, right elbow: Secondary | ICD-10-CM | POA: Diagnosis not present

## 2017-09-15 DIAGNOSIS — M5387 Other specified dorsopathies, lumbosacral region: Secondary | ICD-10-CM | POA: Diagnosis not present

## 2017-09-29 DIAGNOSIS — Z888 Allergy status to other drugs, medicaments and biological substances status: Secondary | ICD-10-CM | POA: Diagnosis not present

## 2017-09-29 DIAGNOSIS — C7A8 Other malignant neuroendocrine tumors: Secondary | ICD-10-CM | POA: Diagnosis not present

## 2017-09-29 DIAGNOSIS — C7B8 Other secondary neuroendocrine tumors: Secondary | ICD-10-CM | POA: Diagnosis not present

## 2017-10-02 DIAGNOSIS — D3A8 Other benign neuroendocrine tumors: Secondary | ICD-10-CM | POA: Diagnosis not present

## 2017-10-03 DIAGNOSIS — M531 Cervicobrachial syndrome: Secondary | ICD-10-CM | POA: Diagnosis not present

## 2017-10-03 DIAGNOSIS — M7701 Medial epicondylitis, right elbow: Secondary | ICD-10-CM | POA: Diagnosis not present

## 2017-10-03 DIAGNOSIS — M5387 Other specified dorsopathies, lumbosacral region: Secondary | ICD-10-CM | POA: Diagnosis not present

## 2017-10-11 ENCOUNTER — Other Ambulatory Visit: Payer: Self-pay | Admitting: Family Medicine

## 2017-10-11 DIAGNOSIS — Z1231 Encounter for screening mammogram for malignant neoplasm of breast: Secondary | ICD-10-CM

## 2017-10-23 DIAGNOSIS — B309 Viral conjunctivitis, unspecified: Secondary | ICD-10-CM | POA: Diagnosis not present

## 2017-10-28 ENCOUNTER — Other Ambulatory Visit: Payer: Self-pay | Admitting: Physician Assistant

## 2017-10-28 DIAGNOSIS — J302 Other seasonal allergic rhinitis: Secondary | ICD-10-CM

## 2017-10-30 NOTE — Telephone Encounter (Signed)
Not seen since 2017, needs appt

## 2017-10-31 NOTE — Telephone Encounter (Signed)
Patient refused to schedule appointment, and reports that she does not need medication.

## 2017-11-07 DIAGNOSIS — M531 Cervicobrachial syndrome: Secondary | ICD-10-CM | POA: Diagnosis not present

## 2017-11-07 DIAGNOSIS — M5386 Other specified dorsopathies, lumbar region: Secondary | ICD-10-CM | POA: Diagnosis not present

## 2017-11-07 DIAGNOSIS — M9902 Segmental and somatic dysfunction of thoracic region: Secondary | ICD-10-CM | POA: Diagnosis not present

## 2017-11-08 ENCOUNTER — Ambulatory Visit
Admission: RE | Admit: 2017-11-08 | Discharge: 2017-11-08 | Disposition: A | Discharge status: Home / Self Care | Payer: BLUE CROSS/BLUE SHIELD | Source: Ambulatory Visit | Attending: Family Medicine | Admitting: Family Medicine

## 2017-11-08 DIAGNOSIS — Z1231 Encounter for screening mammogram for malignant neoplasm of breast: Secondary | ICD-10-CM | POA: Diagnosis not present

## 2017-11-08 HISTORY — DX: Personal history of irradiation: Z92.3

## 2017-11-09 DIAGNOSIS — D3A8 Other benign neuroendocrine tumors: Secondary | ICD-10-CM | POA: Diagnosis not present

## 2017-11-09 DIAGNOSIS — C7B8 Other secondary neuroendocrine tumors: Secondary | ICD-10-CM | POA: Diagnosis not present

## 2017-11-20 DIAGNOSIS — M9902 Segmental and somatic dysfunction of thoracic region: Secondary | ICD-10-CM | POA: Diagnosis not present

## 2017-11-20 DIAGNOSIS — M531 Cervicobrachial syndrome: Secondary | ICD-10-CM | POA: Diagnosis not present

## 2017-11-20 DIAGNOSIS — M436 Torticollis: Secondary | ICD-10-CM | POA: Diagnosis not present

## 2017-11-22 ENCOUNTER — Other Ambulatory Visit: Payer: Self-pay | Admitting: Physician Assistant

## 2017-11-22 DIAGNOSIS — J302 Other seasonal allergic rhinitis: Secondary | ICD-10-CM

## 2017-11-24 DIAGNOSIS — R51 Headache: Secondary | ICD-10-CM | POA: Diagnosis not present

## 2017-11-24 DIAGNOSIS — C7B8 Other secondary neuroendocrine tumors: Secondary | ICD-10-CM | POA: Diagnosis not present

## 2017-11-24 DIAGNOSIS — Z5181 Encounter for therapeutic drug level monitoring: Secondary | ICD-10-CM | POA: Diagnosis not present

## 2017-11-24 DIAGNOSIS — Z79899 Other long term (current) drug therapy: Secondary | ICD-10-CM | POA: Diagnosis not present

## 2017-11-24 DIAGNOSIS — C7A8 Other malignant neuroendocrine tumors: Secondary | ICD-10-CM | POA: Diagnosis not present

## 2017-11-24 DIAGNOSIS — R110 Nausea: Secondary | ICD-10-CM | POA: Diagnosis not present

## 2017-11-27 DIAGNOSIS — D3A8 Other benign neuroendocrine tumors: Secondary | ICD-10-CM | POA: Diagnosis not present

## 2017-11-29 DIAGNOSIS — M531 Cervicobrachial syndrome: Secondary | ICD-10-CM | POA: Diagnosis not present

## 2017-11-29 DIAGNOSIS — M436 Torticollis: Secondary | ICD-10-CM | POA: Diagnosis not present

## 2017-11-29 DIAGNOSIS — M9902 Segmental and somatic dysfunction of thoracic region: Secondary | ICD-10-CM | POA: Diagnosis not present

## 2017-12-01 ENCOUNTER — Other Ambulatory Visit: Payer: Self-pay | Admitting: Physician Assistant

## 2017-12-01 DIAGNOSIS — J302 Other seasonal allergic rhinitis: Secondary | ICD-10-CM

## 2017-12-11 DIAGNOSIS — M9901 Segmental and somatic dysfunction of cervical region: Secondary | ICD-10-CM | POA: Diagnosis not present

## 2017-12-11 DIAGNOSIS — M9902 Segmental and somatic dysfunction of thoracic region: Secondary | ICD-10-CM | POA: Diagnosis not present

## 2017-12-11 DIAGNOSIS — M436 Torticollis: Secondary | ICD-10-CM | POA: Diagnosis not present

## 2017-12-11 DIAGNOSIS — M531 Cervicobrachial syndrome: Secondary | ICD-10-CM | POA: Diagnosis not present

## 2017-12-15 DIAGNOSIS — F329 Major depressive disorder, single episode, unspecified: Secondary | ICD-10-CM | POA: Diagnosis not present

## 2017-12-15 DIAGNOSIS — F419 Anxiety disorder, unspecified: Secondary | ICD-10-CM | POA: Diagnosis not present

## 2017-12-15 DIAGNOSIS — Z0000 Encounter for general adult medical examination without abnormal findings: Secondary | ICD-10-CM | POA: Diagnosis not present

## 2017-12-15 DIAGNOSIS — I10 Essential (primary) hypertension: Secondary | ICD-10-CM | POA: Diagnosis not present

## 2017-12-15 IMAGING — CT CT ABD-PELV W/ CM
2 of 5 series · 15 of 46 positions shown, 17 images · IV contrast (iopamidol)
Comparison: None.

CLINICAL DATA: Abdominal pain and cramping.

EXAM:
CT ABDOMEN AND PELVIS WITH CONTRAST
TECHNIQUE: Multidetector CT imaging of the abdomen and pelvis was performed
using the standard protocol following bolus administration of
intravenous contrast.
CONTRAST:  100mL 6VH2DE-NYY IOPAMIDOL (6VH2DE-NYY) INJECTION 61%

[Series 2: routine abd/pel with · axial · 0.79mm/px · z∈[-1102,-697]mm · 12 of 93 slices shown, 14 images]
[im 6/93  soft-tissue]
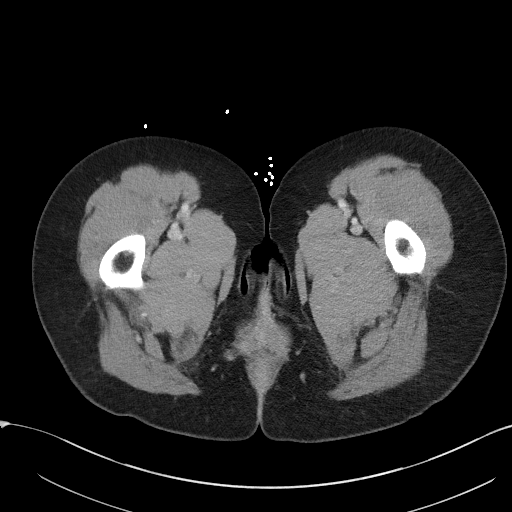
[im 6/93  bone]
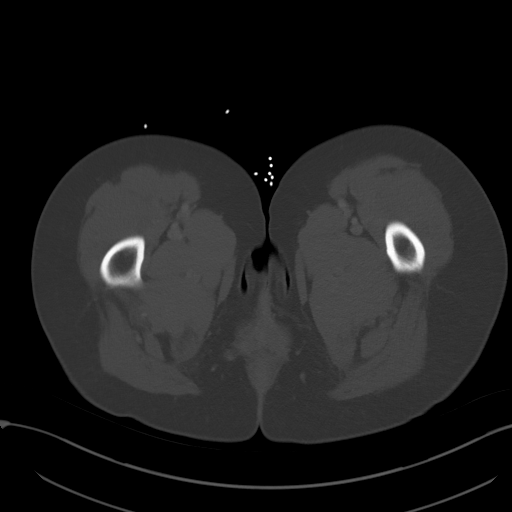
[im 17/93  soft-tissue]
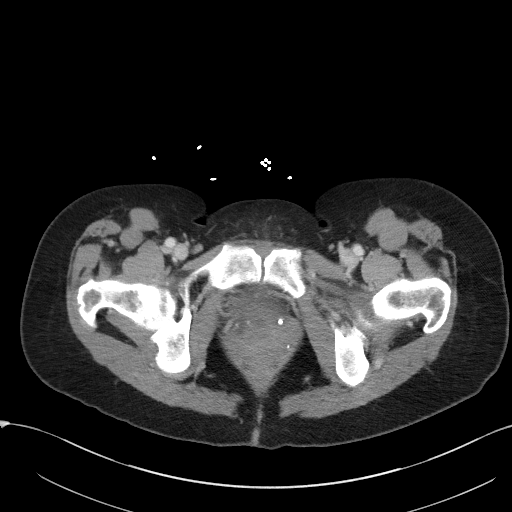
[im 22/93  soft-tissue]
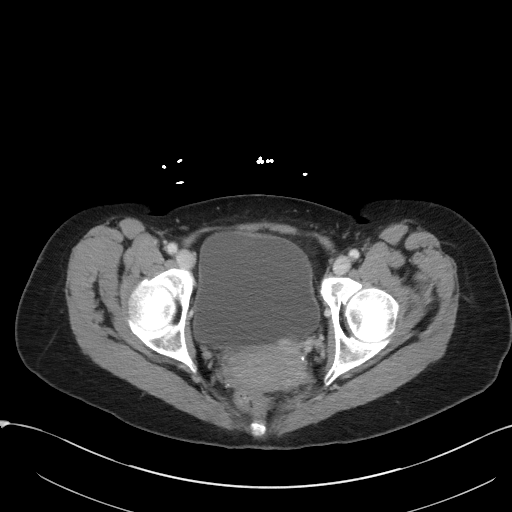
[im 28/93  soft-tissue]
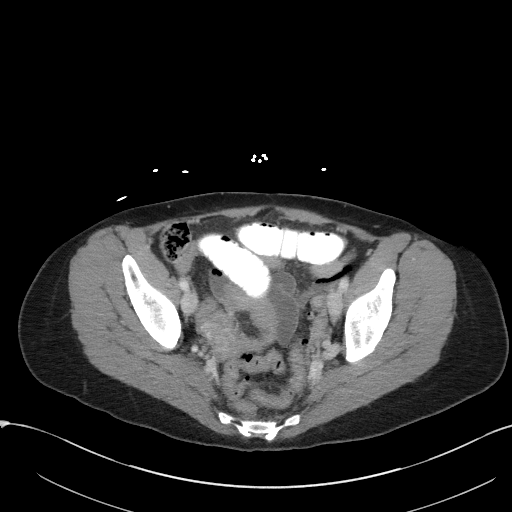
[im 38/93  soft-tissue]
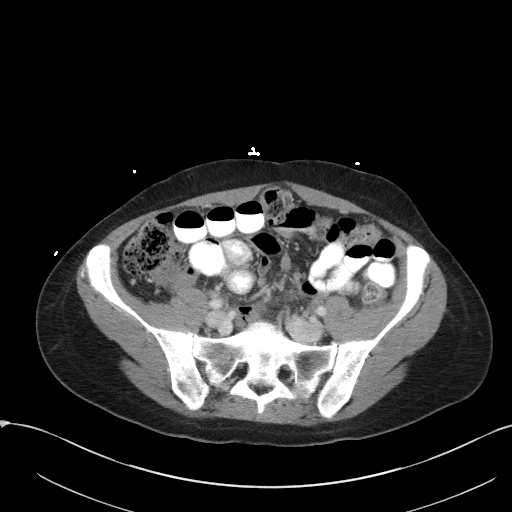
[im 44/93  soft-tissue]
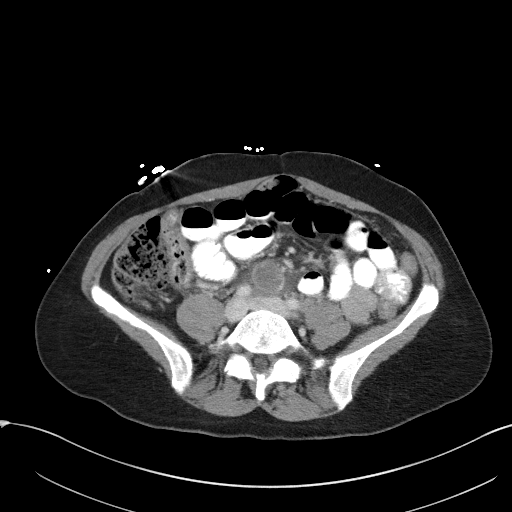
[im 49/93  soft-tissue]
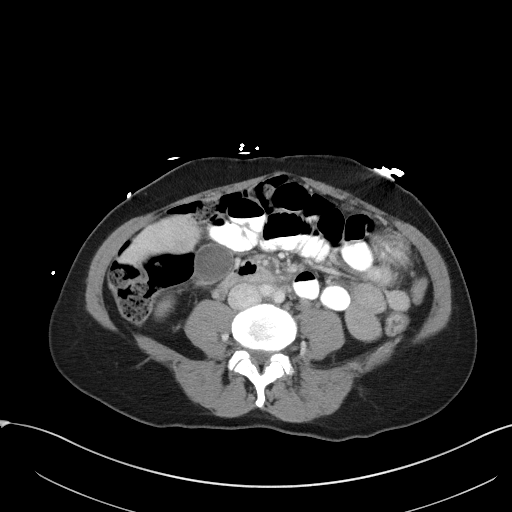
[im 60/93  soft-tissue]
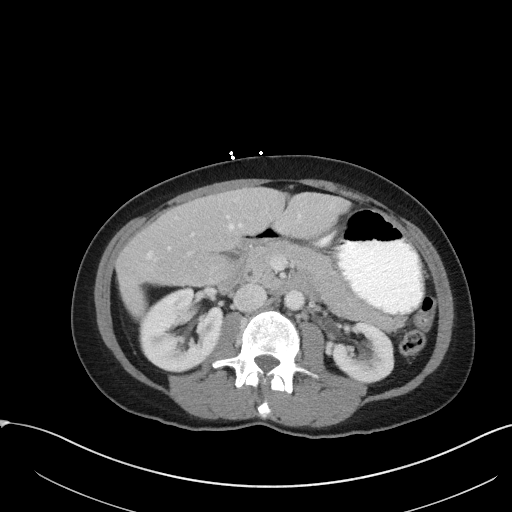
[im 65/93  soft-tissue]
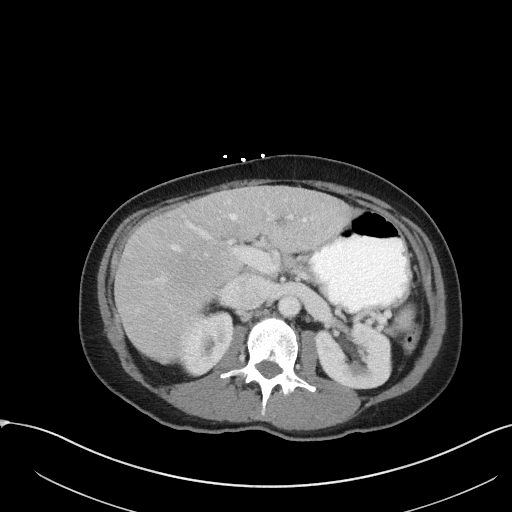
[im 65/93  bone]
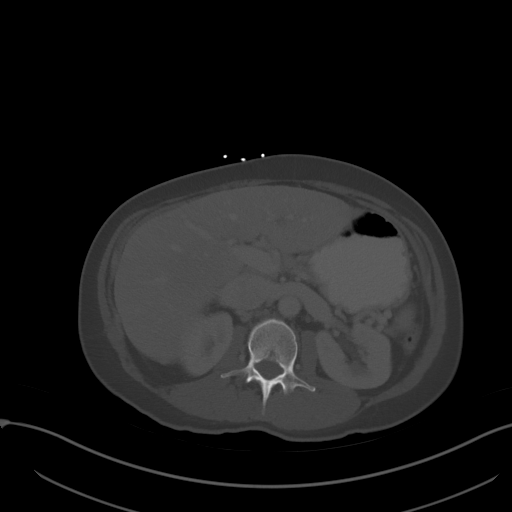
[im 71/93  soft-tissue]
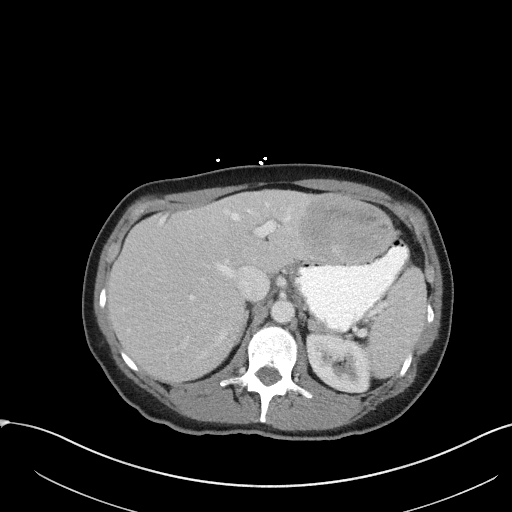
[im 82/93  soft-tissue]
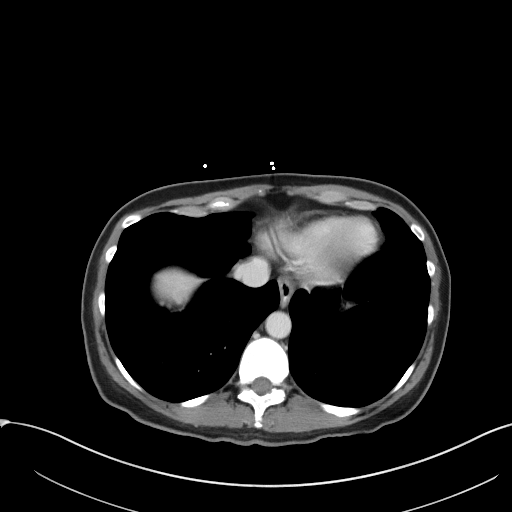
[im 87/93  soft-tissue]
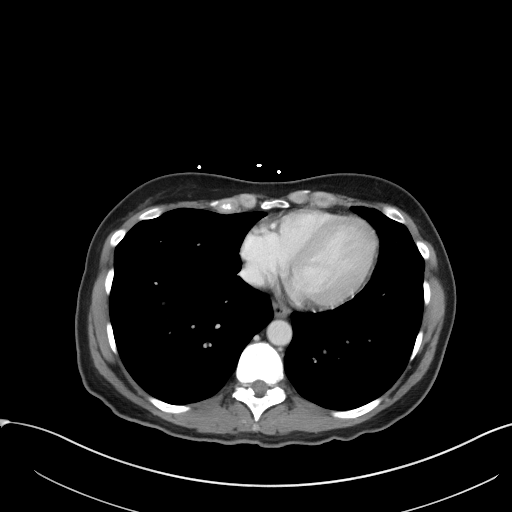

[Series 5: coronal st · coronal · 0.68mm/px · 3 of 75 slices shown]
[im 25/75  soft-tissue]
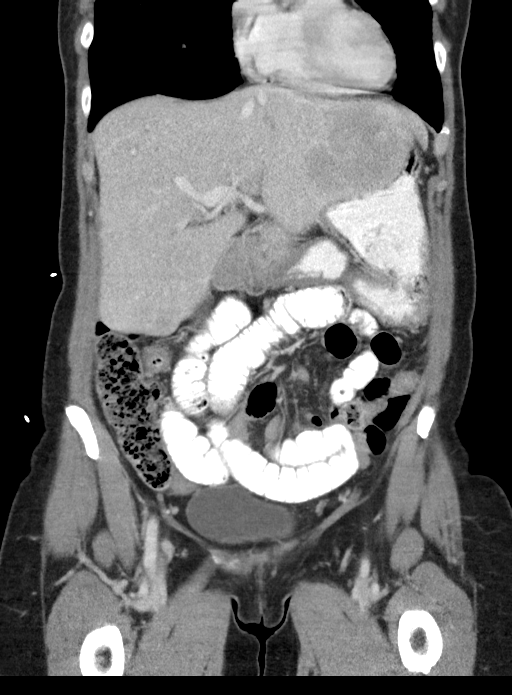
[im 33/75  soft-tissue]
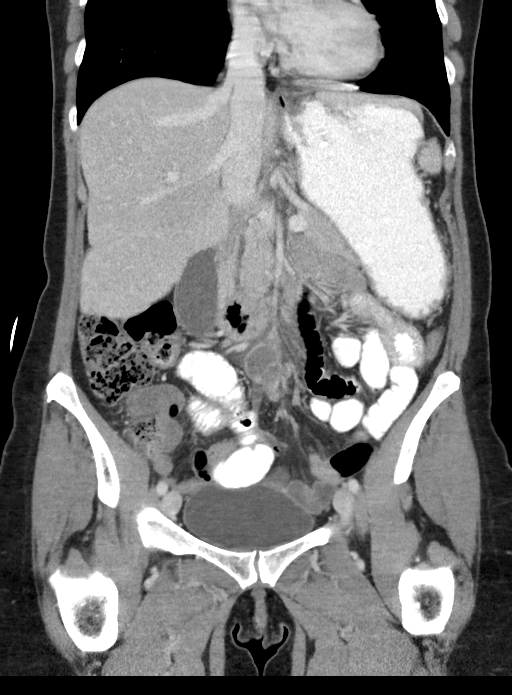
[im 42/75  soft-tissue]
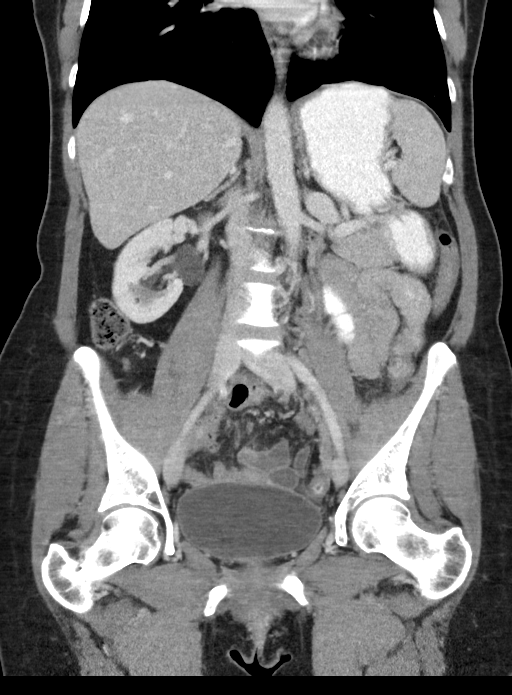

[15 of 46 positions shown; findings below may reference images not displayed]

FINDINGS: Lower chest: No pulmonary nodules. No visible pleural or pericardial
effusion.

Hepatobiliary: There is a heterogeneous, predominantly low
attenuation lesion within the left hepatic lobe, measuring up to
x 4.8 cm. The lesion was not present on the prior examination. No
other focal hepatic lesions. No ascites. No biliary dilatation or
portal venous gas. Normal gallbladder.

Pancreas: Normal pancreatic contours and enhancement. No
peripancreatic fluid collection or pancreatic ductal dilatation.

Spleen: Normal.

Adrenals/Urinary Tract: Normal adrenal glands. No hydronephrosis or
solid renal mass.

Stomach/Bowel: There are multiple loops of prominent fluid-filled
small bowel without dilatation by CT size criteria. No evidence of
an acute inflammatory process. Normal appendix.

Vascular/Lymphatic: Normal course and caliber of the major abdominal
vessels. There is a necrotic nodal mass measuring 2.2 cm just below
the level of the aortic bifurcation, which has increased in size
from the prior examination.

Reproductive: Status post hysterectomy. The ovaries are not clearly
identified.

Musculoskeletal: No lytic or blastic osseous lesion. Normal
visualized extrathoracic and extraperitoneal soft tissues.

Other: No contributory non-categorized findings.
IMPRESSION: 1. Increased size of hypoattenuating mass at the aortic bifurcation.
This may be a gastrointestinal stromal tumor, carcinoid tumor or
possibly a metastatic nodal deposit.
2. New left hepatic lobe mass measuring up to 7.6 cm. This is
favored to represent a hepatic metastasis. Histologic sampling
should be considered.
3. Multiple prominent loops of central abdominal small bowel may
indicate early/developing obstruction versus local adynamic ileus.

## 2017-12-29 DIAGNOSIS — D3A8 Other benign neuroendocrine tumors: Secondary | ICD-10-CM | POA: Diagnosis not present

## 2018-01-01 DIAGNOSIS — M9903 Segmental and somatic dysfunction of lumbar region: Secondary | ICD-10-CM | POA: Diagnosis not present

## 2018-01-01 DIAGNOSIS — M9902 Segmental and somatic dysfunction of thoracic region: Secondary | ICD-10-CM | POA: Diagnosis not present

## 2018-01-01 DIAGNOSIS — M5386 Other specified dorsopathies, lumbar region: Secondary | ICD-10-CM | POA: Diagnosis not present

## 2018-01-01 DIAGNOSIS — M531 Cervicobrachial syndrome: Secondary | ICD-10-CM | POA: Diagnosis not present

## 2018-01-12 DIAGNOSIS — M9902 Segmental and somatic dysfunction of thoracic region: Secondary | ICD-10-CM | POA: Diagnosis not present

## 2018-01-12 DIAGNOSIS — M531 Cervicobrachial syndrome: Secondary | ICD-10-CM | POA: Diagnosis not present

## 2018-01-12 DIAGNOSIS — M5386 Other specified dorsopathies, lumbar region: Secondary | ICD-10-CM | POA: Diagnosis not present

## 2018-01-12 DIAGNOSIS — M9903 Segmental and somatic dysfunction of lumbar region: Secondary | ICD-10-CM | POA: Diagnosis not present

## 2018-01-19 DIAGNOSIS — C7B8 Other secondary neuroendocrine tumors: Secondary | ICD-10-CM | POA: Diagnosis not present

## 2018-01-19 DIAGNOSIS — Z5181 Encounter for therapeutic drug level monitoring: Secondary | ICD-10-CM | POA: Diagnosis not present

## 2018-01-19 DIAGNOSIS — Z79899 Other long term (current) drug therapy: Secondary | ICD-10-CM | POA: Diagnosis not present

## 2018-01-19 DIAGNOSIS — R5383 Other fatigue: Secondary | ICD-10-CM | POA: Diagnosis not present

## 2018-01-19 DIAGNOSIS — C7A8 Other malignant neuroendocrine tumors: Secondary | ICD-10-CM | POA: Diagnosis not present

## 2018-01-22 DIAGNOSIS — C7A8 Other malignant neuroendocrine tumors: Secondary | ICD-10-CM | POA: Diagnosis not present

## 2018-01-22 DIAGNOSIS — C7B8 Other secondary neuroendocrine tumors: Secondary | ICD-10-CM | POA: Diagnosis not present

## 2018-01-22 DIAGNOSIS — Z79899 Other long term (current) drug therapy: Secondary | ICD-10-CM | POA: Diagnosis not present

## 2018-01-24 DIAGNOSIS — B0223 Postherpetic polyneuropathy: Secondary | ICD-10-CM | POA: Diagnosis not present

## 2018-01-24 DIAGNOSIS — L918 Other hypertrophic disorders of the skin: Secondary | ICD-10-CM | POA: Diagnosis not present

## 2018-01-24 DIAGNOSIS — I10 Essential (primary) hypertension: Secondary | ICD-10-CM | POA: Diagnosis not present

## 2018-02-06 DIAGNOSIS — M9902 Segmental and somatic dysfunction of thoracic region: Secondary | ICD-10-CM | POA: Diagnosis not present

## 2018-02-06 DIAGNOSIS — M9903 Segmental and somatic dysfunction of lumbar region: Secondary | ICD-10-CM | POA: Diagnosis not present

## 2018-02-06 DIAGNOSIS — M531 Cervicobrachial syndrome: Secondary | ICD-10-CM | POA: Diagnosis not present

## 2018-02-06 DIAGNOSIS — M5386 Other specified dorsopathies, lumbar region: Secondary | ICD-10-CM | POA: Diagnosis not present

## 2018-02-07 DIAGNOSIS — M531 Cervicobrachial syndrome: Secondary | ICD-10-CM | POA: Diagnosis not present

## 2018-02-07 DIAGNOSIS — M25511 Pain in right shoulder: Secondary | ICD-10-CM | POA: Diagnosis not present

## 2018-02-07 DIAGNOSIS — M19011 Primary osteoarthritis, right shoulder: Secondary | ICD-10-CM | POA: Diagnosis not present

## 2018-02-07 DIAGNOSIS — M7551 Bursitis of right shoulder: Secondary | ICD-10-CM | POA: Diagnosis not present

## 2018-02-07 DIAGNOSIS — M9902 Segmental and somatic dysfunction of thoracic region: Secondary | ICD-10-CM | POA: Diagnosis not present

## 2018-02-07 DIAGNOSIS — D3A8 Other benign neuroendocrine tumors: Secondary | ICD-10-CM | POA: Diagnosis not present

## 2018-02-07 DIAGNOSIS — M9903 Segmental and somatic dysfunction of lumbar region: Secondary | ICD-10-CM | POA: Diagnosis not present

## 2018-02-07 DIAGNOSIS — M5386 Other specified dorsopathies, lumbar region: Secondary | ICD-10-CM | POA: Diagnosis not present

## 2018-02-08 DIAGNOSIS — F419 Anxiety disorder, unspecified: Secondary | ICD-10-CM | POA: Diagnosis not present

## 2018-02-08 DIAGNOSIS — C786 Secondary malignant neoplasm of retroperitoneum and peritoneum: Secondary | ICD-10-CM | POA: Diagnosis not present

## 2018-02-08 DIAGNOSIS — Z79899 Other long term (current) drug therapy: Secondary | ICD-10-CM | POA: Diagnosis not present

## 2018-02-08 DIAGNOSIS — Z888 Allergy status to other drugs, medicaments and biological substances status: Secondary | ICD-10-CM | POA: Diagnosis not present

## 2018-02-08 DIAGNOSIS — F329 Major depressive disorder, single episode, unspecified: Secondary | ICD-10-CM | POA: Diagnosis not present

## 2018-02-08 DIAGNOSIS — R918 Other nonspecific abnormal finding of lung field: Secondary | ICD-10-CM | POA: Diagnosis not present

## 2018-02-08 DIAGNOSIS — D321 Benign neoplasm of spinal meninges: Secondary | ICD-10-CM | POA: Diagnosis not present

## 2018-02-08 DIAGNOSIS — I10 Essential (primary) hypertension: Secondary | ICD-10-CM | POA: Diagnosis not present

## 2018-02-08 DIAGNOSIS — C7B8 Other secondary neuroendocrine tumors: Secondary | ICD-10-CM | POA: Diagnosis not present

## 2018-02-08 DIAGNOSIS — R29818 Other symptoms and signs involving the nervous system: Secondary | ICD-10-CM | POA: Diagnosis not present

## 2018-02-08 DIAGNOSIS — M5412 Radiculopathy, cervical region: Secondary | ICD-10-CM | POA: Diagnosis not present

## 2018-02-08 DIAGNOSIS — M47812 Spondylosis without myelopathy or radiculopathy, cervical region: Secondary | ICD-10-CM | POA: Diagnosis not present

## 2018-02-08 DIAGNOSIS — C7A8 Other malignant neuroendocrine tumors: Secondary | ICD-10-CM | POA: Diagnosis not present

## 2018-02-08 DIAGNOSIS — R21 Rash and other nonspecific skin eruption: Secondary | ICD-10-CM | POA: Diagnosis not present

## 2018-02-08 DIAGNOSIS — M50123 Cervical disc disorder at C6-C7 level with radiculopathy: Secondary | ICD-10-CM | POA: Diagnosis not present

## 2018-02-08 DIAGNOSIS — C787 Secondary malignant neoplasm of liver and intrahepatic bile duct: Secondary | ICD-10-CM | POA: Diagnosis not present

## 2018-02-08 DIAGNOSIS — M25511 Pain in right shoulder: Secondary | ICD-10-CM | POA: Diagnosis not present

## 2018-02-08 DIAGNOSIS — D1809 Hemangioma of other sites: Secondary | ICD-10-CM | POA: Diagnosis not present

## 2018-02-08 DIAGNOSIS — G540 Brachial plexus disorders: Secondary | ICD-10-CM | POA: Diagnosis not present

## 2018-02-08 DIAGNOSIS — K5900 Constipation, unspecified: Secondary | ICD-10-CM | POA: Diagnosis not present

## 2018-02-08 DIAGNOSIS — D3A8 Other benign neuroendocrine tumors: Secondary | ICD-10-CM | POA: Diagnosis not present

## 2018-02-08 DIAGNOSIS — M4722 Other spondylosis with radiculopathy, cervical region: Secondary | ICD-10-CM | POA: Diagnosis not present

## 2018-02-10 DIAGNOSIS — C786 Secondary malignant neoplasm of retroperitoneum and peritoneum: Secondary | ICD-10-CM | POA: Diagnosis not present

## 2018-02-10 DIAGNOSIS — D3A8 Other benign neuroendocrine tumors: Secondary | ICD-10-CM | POA: Diagnosis not present

## 2018-02-10 DIAGNOSIS — C7A8 Other malignant neuroendocrine tumors: Secondary | ICD-10-CM | POA: Diagnosis not present

## 2018-02-10 DIAGNOSIS — F419 Anxiety disorder, unspecified: Secondary | ICD-10-CM | POA: Diagnosis not present

## 2018-02-10 DIAGNOSIS — M25511 Pain in right shoulder: Secondary | ICD-10-CM | POA: Diagnosis not present

## 2018-02-10 DIAGNOSIS — M50123 Cervical disc disorder at C6-C7 level with radiculopathy: Secondary | ICD-10-CM | POA: Diagnosis not present

## 2018-02-10 DIAGNOSIS — M5412 Radiculopathy, cervical region: Secondary | ICD-10-CM | POA: Diagnosis not present

## 2018-02-10 DIAGNOSIS — C7B8 Other secondary neuroendocrine tumors: Secondary | ICD-10-CM | POA: Diagnosis not present

## 2018-02-10 DIAGNOSIS — M4722 Other spondylosis with radiculopathy, cervical region: Secondary | ICD-10-CM | POA: Diagnosis not present

## 2018-02-11 DIAGNOSIS — M25511 Pain in right shoulder: Secondary | ICD-10-CM | POA: Diagnosis not present

## 2018-02-11 DIAGNOSIS — R29818 Other symptoms and signs involving the nervous system: Secondary | ICD-10-CM | POA: Diagnosis not present

## 2018-02-11 DIAGNOSIS — C7B8 Other secondary neuroendocrine tumors: Secondary | ICD-10-CM | POA: Diagnosis not present

## 2018-02-11 DIAGNOSIS — D3A8 Other benign neuroendocrine tumors: Secondary | ICD-10-CM | POA: Diagnosis not present

## 2018-02-11 DIAGNOSIS — F419 Anxiety disorder, unspecified: Secondary | ICD-10-CM | POA: Diagnosis not present

## 2018-02-13 DIAGNOSIS — C7B8 Other secondary neuroendocrine tumors: Secondary | ICD-10-CM | POA: Diagnosis not present

## 2018-02-13 DIAGNOSIS — M25511 Pain in right shoulder: Secondary | ICD-10-CM | POA: Diagnosis not present

## 2018-02-13 DIAGNOSIS — F419 Anxiety disorder, unspecified: Secondary | ICD-10-CM | POA: Diagnosis not present

## 2018-02-13 DIAGNOSIS — D3A8 Other benign neuroendocrine tumors: Secondary | ICD-10-CM | POA: Diagnosis not present

## 2018-02-14 DIAGNOSIS — Z1283 Encounter for screening for malignant neoplasm of skin: Secondary | ICD-10-CM | POA: Diagnosis not present

## 2018-02-14 DIAGNOSIS — D229 Melanocytic nevi, unspecified: Secondary | ICD-10-CM | POA: Diagnosis not present

## 2018-02-14 DIAGNOSIS — D2272 Melanocytic nevi of left lower limb, including hip: Secondary | ICD-10-CM | POA: Diagnosis not present

## 2018-02-14 DIAGNOSIS — S41101S Unspecified open wound of right upper arm, sequela: Secondary | ICD-10-CM | POA: Diagnosis not present

## 2018-02-14 DIAGNOSIS — D225 Melanocytic nevi of trunk: Secondary | ICD-10-CM | POA: Diagnosis not present

## 2018-02-14 DIAGNOSIS — D485 Neoplasm of uncertain behavior of skin: Secondary | ICD-10-CM | POA: Diagnosis not present

## 2018-02-23 DIAGNOSIS — M9901 Segmental and somatic dysfunction of cervical region: Secondary | ICD-10-CM | POA: Diagnosis not present

## 2018-02-23 DIAGNOSIS — M9902 Segmental and somatic dysfunction of thoracic region: Secondary | ICD-10-CM | POA: Diagnosis not present

## 2018-02-23 DIAGNOSIS — M531 Cervicobrachial syndrome: Secondary | ICD-10-CM | POA: Diagnosis not present

## 2018-02-23 DIAGNOSIS — M4004 Postural kyphosis, thoracic region: Secondary | ICD-10-CM | POA: Diagnosis not present

## 2018-02-25 ENCOUNTER — Other Ambulatory Visit: Payer: Self-pay | Admitting: Physician Assistant

## 2018-02-25 DIAGNOSIS — I10 Essential (primary) hypertension: Secondary | ICD-10-CM

## 2018-03-05 DIAGNOSIS — D3A8 Other benign neuroendocrine tumors: Secondary | ICD-10-CM | POA: Diagnosis not present

## 2018-03-05 DIAGNOSIS — M5412 Radiculopathy, cervical region: Secondary | ICD-10-CM | POA: Diagnosis not present

## 2018-03-16 DIAGNOSIS — R5383 Other fatigue: Secondary | ICD-10-CM | POA: Diagnosis not present

## 2018-03-16 DIAGNOSIS — M2550 Pain in unspecified joint: Secondary | ICD-10-CM | POA: Diagnosis not present

## 2018-03-16 DIAGNOSIS — C7A012 Malignant carcinoid tumor of the ileum: Secondary | ICD-10-CM | POA: Diagnosis not present

## 2018-03-16 DIAGNOSIS — C7B8 Other secondary neuroendocrine tumors: Secondary | ICD-10-CM | POA: Diagnosis not present

## 2018-03-16 DIAGNOSIS — C7A8 Other malignant neuroendocrine tumors: Secondary | ICD-10-CM | POA: Diagnosis not present

## 2018-03-19 DIAGNOSIS — D3A8 Other benign neuroendocrine tumors: Secondary | ICD-10-CM | POA: Diagnosis not present

## 2018-04-16 DIAGNOSIS — Z9049 Acquired absence of other specified parts of digestive tract: Secondary | ICD-10-CM | POA: Diagnosis not present

## 2018-04-16 DIAGNOSIS — R5383 Other fatigue: Secondary | ICD-10-CM | POA: Diagnosis not present

## 2018-04-16 DIAGNOSIS — R110 Nausea: Secondary | ICD-10-CM | POA: Diagnosis not present

## 2018-04-16 DIAGNOSIS — R635 Abnormal weight gain: Secondary | ICD-10-CM | POA: Diagnosis not present

## 2018-04-16 DIAGNOSIS — D3A8 Other benign neuroendocrine tumors: Secondary | ICD-10-CM | POA: Diagnosis not present

## 2018-04-16 DIAGNOSIS — I10 Essential (primary) hypertension: Secondary | ICD-10-CM | POA: Diagnosis not present

## 2018-04-16 DIAGNOSIS — R232 Flushing: Secondary | ICD-10-CM | POA: Diagnosis not present

## 2018-04-16 DIAGNOSIS — Z713 Dietary counseling and surveillance: Secondary | ICD-10-CM | POA: Diagnosis not present

## 2018-04-16 DIAGNOSIS — C7B8 Other secondary neuroendocrine tumors: Secondary | ICD-10-CM | POA: Diagnosis not present

## 2018-04-16 DIAGNOSIS — Z6825 Body mass index (BMI) 25.0-25.9, adult: Secondary | ICD-10-CM | POA: Diagnosis not present

## 2018-05-15 DIAGNOSIS — D3A8 Other benign neuroendocrine tumors: Secondary | ICD-10-CM | POA: Diagnosis not present

## 2018-05-16 DIAGNOSIS — M5386 Other specified dorsopathies, lumbar region: Secondary | ICD-10-CM | POA: Diagnosis not present

## 2018-05-16 DIAGNOSIS — M9902 Segmental and somatic dysfunction of thoracic region: Secondary | ICD-10-CM | POA: Diagnosis not present

## 2018-05-16 DIAGNOSIS — M531 Cervicobrachial syndrome: Secondary | ICD-10-CM | POA: Diagnosis not present

## 2018-05-16 DIAGNOSIS — M9901 Segmental and somatic dysfunction of cervical region: Secondary | ICD-10-CM | POA: Diagnosis not present

## 2018-06-08 DIAGNOSIS — M9902 Segmental and somatic dysfunction of thoracic region: Secondary | ICD-10-CM | POA: Diagnosis not present

## 2018-06-08 DIAGNOSIS — M531 Cervicobrachial syndrome: Secondary | ICD-10-CM | POA: Diagnosis not present

## 2018-06-08 DIAGNOSIS — M9901 Segmental and somatic dysfunction of cervical region: Secondary | ICD-10-CM | POA: Diagnosis not present

## 2018-06-11 DIAGNOSIS — D3A8 Other benign neuroendocrine tumors: Secondary | ICD-10-CM | POA: Diagnosis not present

## 2018-06-19 DIAGNOSIS — M5386 Other specified dorsopathies, lumbar region: Secondary | ICD-10-CM | POA: Diagnosis not present

## 2018-06-19 DIAGNOSIS — M9901 Segmental and somatic dysfunction of cervical region: Secondary | ICD-10-CM | POA: Diagnosis not present

## 2018-06-19 DIAGNOSIS — M531 Cervicobrachial syndrome: Secondary | ICD-10-CM | POA: Diagnosis not present

## 2018-07-06 DIAGNOSIS — D3A8 Other benign neuroendocrine tumors: Secondary | ICD-10-CM | POA: Diagnosis not present

## 2018-07-06 DIAGNOSIS — C7B8 Other secondary neuroendocrine tumors: Secondary | ICD-10-CM | POA: Diagnosis not present

## 2018-07-06 DIAGNOSIS — C786 Secondary malignant neoplasm of retroperitoneum and peritoneum: Secondary | ICD-10-CM | POA: Diagnosis not present

## 2018-07-09 DIAGNOSIS — Z90710 Acquired absence of both cervix and uterus: Secondary | ICD-10-CM | POA: Diagnosis not present

## 2018-07-09 DIAGNOSIS — Z79899 Other long term (current) drug therapy: Secondary | ICD-10-CM | POA: Diagnosis not present

## 2018-07-09 DIAGNOSIS — R5383 Other fatigue: Secondary | ICD-10-CM | POA: Diagnosis not present

## 2018-07-09 DIAGNOSIS — C786 Secondary malignant neoplasm of retroperitoneum and peritoneum: Secondary | ICD-10-CM | POA: Diagnosis not present

## 2018-07-09 DIAGNOSIS — Z8711 Personal history of peptic ulcer disease: Secondary | ICD-10-CM | POA: Diagnosis not present

## 2018-07-09 DIAGNOSIS — R350 Frequency of micturition: Secondary | ICD-10-CM | POA: Diagnosis not present

## 2018-07-09 DIAGNOSIS — R110 Nausea: Secondary | ICD-10-CM | POA: Diagnosis not present

## 2018-07-09 DIAGNOSIS — R1084 Generalized abdominal pain: Secondary | ICD-10-CM | POA: Diagnosis not present

## 2018-07-09 DIAGNOSIS — R6889 Other general symptoms and signs: Secondary | ICD-10-CM | POA: Diagnosis not present

## 2018-07-09 DIAGNOSIS — C7A8 Other malignant neuroendocrine tumors: Secondary | ICD-10-CM | POA: Diagnosis not present

## 2018-07-09 DIAGNOSIS — Z9049 Acquired absence of other specified parts of digestive tract: Secondary | ICD-10-CM | POA: Diagnosis not present

## 2018-07-09 DIAGNOSIS — I10 Essential (primary) hypertension: Secondary | ICD-10-CM | POA: Diagnosis not present

## 2018-07-09 DIAGNOSIS — C801 Malignant (primary) neoplasm, unspecified: Secondary | ICD-10-CM | POA: Diagnosis not present

## 2018-07-09 DIAGNOSIS — C7B8 Other secondary neuroendocrine tumors: Secondary | ICD-10-CM | POA: Diagnosis not present

## 2018-07-09 DIAGNOSIS — R232 Flushing: Secondary | ICD-10-CM | POA: Diagnosis not present

## 2018-07-11 DIAGNOSIS — C7B8 Other secondary neuroendocrine tumors: Secondary | ICD-10-CM | POA: Diagnosis not present

## 2018-08-02 DIAGNOSIS — Z86018 Personal history of other benign neoplasm: Secondary | ICD-10-CM | POA: Insufficient documentation

## 2018-08-06 DIAGNOSIS — D3A8 Other benign neuroendocrine tumors: Secondary | ICD-10-CM | POA: Diagnosis not present

## 2018-09-03 DIAGNOSIS — D3A8 Other benign neuroendocrine tumors: Secondary | ICD-10-CM | POA: Diagnosis not present

## 2018-09-05 DIAGNOSIS — S52125A Nondisplaced fracture of head of left radius, initial encounter for closed fracture: Secondary | ICD-10-CM | POA: Diagnosis not present

## 2018-09-12 DIAGNOSIS — L309 Dermatitis, unspecified: Secondary | ICD-10-CM | POA: Diagnosis not present

## 2018-09-12 DIAGNOSIS — S52125D Nondisplaced fracture of head of left radius, subsequent encounter for closed fracture with routine healing: Secondary | ICD-10-CM | POA: Diagnosis not present

## 2018-09-12 DIAGNOSIS — D485 Neoplasm of uncertain behavior of skin: Secondary | ICD-10-CM | POA: Diagnosis not present

## 2019-02-21 ENCOUNTER — Other Ambulatory Visit: Payer: Self-pay | Admitting: Family Medicine

## 2019-02-21 ENCOUNTER — Ambulatory Visit
Admission: RE | Admit: 2019-02-21 | Discharge: 2019-02-21 | Disposition: A | Discharge status: Home / Self Care | Payer: BC Managed Care – PPO | Source: Ambulatory Visit | Attending: Family Medicine | Admitting: Family Medicine

## 2019-02-21 DIAGNOSIS — Z1231 Encounter for screening mammogram for malignant neoplasm of breast: Secondary | ICD-10-CM | POA: Diagnosis not present

## 2019-02-26 ENCOUNTER — Other Ambulatory Visit: Payer: Self-pay | Admitting: Family Medicine

## 2019-02-26 DIAGNOSIS — R921 Mammographic calcification found on diagnostic imaging of breast: Secondary | ICD-10-CM

## 2019-02-26 DIAGNOSIS — R928 Other abnormal and inconclusive findings on diagnostic imaging of breast: Secondary | ICD-10-CM

## 2019-03-01 ENCOUNTER — Ambulatory Visit
Admission: RE | Admit: 2019-03-01 | Discharge: 2019-03-01 | Disposition: A | Discharge status: Home / Self Care | Payer: BC Managed Care – PPO | Source: Ambulatory Visit | Attending: Family Medicine | Admitting: Family Medicine

## 2019-03-01 DIAGNOSIS — R921 Mammographic calcification found on diagnostic imaging of breast: Secondary | ICD-10-CM | POA: Insufficient documentation

## 2019-03-01 DIAGNOSIS — R928 Other abnormal and inconclusive findings on diagnostic imaging of breast: Secondary | ICD-10-CM | POA: Insufficient documentation

## 2020-02-26 ENCOUNTER — Encounter: Payer: BC Managed Care – PPO | Admitting: Dermatology

## 2020-09-07 ENCOUNTER — Other Ambulatory Visit: Payer: Self-pay | Admitting: Family Medicine

## 2020-09-07 DIAGNOSIS — Z1231 Encounter for screening mammogram for malignant neoplasm of breast: Secondary | ICD-10-CM

## 2020-09-15 ENCOUNTER — Ambulatory Visit
Admission: RE | Admit: 2020-09-15 | Discharge: 2020-09-15 | Disposition: A | Discharge status: Home / Self Care | Payer: BC Managed Care – PPO | Source: Ambulatory Visit | Attending: Family Medicine | Admitting: Family Medicine

## 2020-09-15 ENCOUNTER — Other Ambulatory Visit: Payer: Self-pay

## 2020-09-15 DIAGNOSIS — Z1231 Encounter for screening mammogram for malignant neoplasm of breast: Secondary | ICD-10-CM | POA: Diagnosis not present

## 2022-02-22 ENCOUNTER — Encounter: Payer: Self-pay | Admitting: Ophthalmology

## 2022-02-24 NOTE — Discharge Instructions (Signed)

## 2022-02-28 ENCOUNTER — Ambulatory Visit
Admission: RE | Admit: 2022-02-28 | Discharge: 2022-02-28 | Disposition: A | Discharge status: Home / Self Care | Payer: Medicare Other | Source: Ambulatory Visit | Attending: Ophthalmology | Admitting: Ophthalmology

## 2022-02-28 ENCOUNTER — Ambulatory Visit: Payer: Medicare Other | Admitting: Anesthesiology

## 2022-02-28 ENCOUNTER — Encounter
Admission: RE | Disposition: A | Discharge status: Home / Self Care | Payer: Self-pay | Source: Ambulatory Visit | Attending: Ophthalmology

## 2022-02-28 ENCOUNTER — Encounter: Payer: Self-pay | Admitting: Ophthalmology

## 2022-02-28 ENCOUNTER — Other Ambulatory Visit: Payer: Self-pay

## 2022-02-28 DIAGNOSIS — Z85848 Personal history of malignant neoplasm of other parts of nervous tissue: Secondary | ICD-10-CM | POA: Insufficient documentation

## 2022-02-28 DIAGNOSIS — H2511 Age-related nuclear cataract, right eye: Secondary | ICD-10-CM | POA: Diagnosis present

## 2022-02-28 DIAGNOSIS — I10 Essential (primary) hypertension: Secondary | ICD-10-CM | POA: Diagnosis not present

## 2022-02-28 DIAGNOSIS — Z86018 Personal history of other benign neoplasm: Secondary | ICD-10-CM | POA: Diagnosis not present

## 2022-02-28 DIAGNOSIS — Z98891 History of uterine scar from previous surgery: Secondary | ICD-10-CM | POA: Insufficient documentation

## 2022-02-28 DIAGNOSIS — Z923 Personal history of irradiation: Secondary | ICD-10-CM | POA: Insufficient documentation

## 2022-02-28 DIAGNOSIS — Z90710 Acquired absence of both cervix and uterus: Secondary | ICD-10-CM | POA: Insufficient documentation

## 2022-02-28 DIAGNOSIS — Z8505 Personal history of malignant neoplasm of liver: Secondary | ICD-10-CM | POA: Insufficient documentation

## 2022-02-28 HISTORY — DX: Motion sickness, initial encounter: T75.3XXA

## 2022-02-28 HISTORY — PX: CATARACT EXTRACTION W/PHACO: SHX586

## 2022-02-28 SURGERY — PHACOEMULSIFICATION, CATARACT, WITH IOL INSERTION
Anesthesia: Monitor Anesthesia Care | Site: Eye | Laterality: Right

## 2022-02-28 MED ORDER — MOXIFLOXACIN HCL 0.5 % OP SOLN
OPHTHALMIC | Status: DC | PRN
  Administered 2022-02-28: .2 mL via OPHTHALMIC

## 2022-02-28 MED ORDER — LIDOCAINE HCL (PF) 2 % IJ SOLN
INTRAOCULAR | Status: DC | PRN
  Administered 2022-02-28: 1 mL via INTRAOCULAR

## 2022-02-28 MED ORDER — MIDAZOLAM HCL 2 MG/2ML IJ SOLN
INTRAMUSCULAR | Status: DC | PRN
  Administered 2022-02-28: 2 mg via INTRAVENOUS

## 2022-02-28 MED ORDER — SIGHTPATH DOSE#1 BSS IO SOLN
INTRAOCULAR | Status: DC | PRN
  Administered 2022-02-28: 104 mL via OPHTHALMIC

## 2022-02-28 MED ORDER — SIGHTPATH DOSE#1 NA HYALUR & NA CHOND-NA HYALUR IO KIT
PACK | INTRAOCULAR | Status: DC | PRN
  Administered 2022-02-28: 1 via OPHTHALMIC

## 2022-02-28 MED ORDER — LACTATED RINGERS IV SOLN: INTRAVENOUS | Status: DC

## 2022-02-28 MED ORDER — TETRACAINE HCL 0.5 % OP SOLN
1.0000 [drp] | OPHTHALMIC | Status: DC | PRN
  Administered 2022-02-28 (×3): 1 [drp] via OPHTHALMIC

## 2022-02-28 MED ORDER — SIGHTPATH DOSE#1 BSS IO SOLN
INTRAOCULAR | Status: DC | PRN
  Administered 2022-02-28: 15 mL

## 2022-02-28 MED ORDER — FENTANYL CITRATE (PF) 100 MCG/2ML IJ SOLN
INTRAMUSCULAR | Status: DC | PRN
  Administered 2022-02-28: 50 ug via INTRAVENOUS

## 2022-02-28 MED ORDER — ARMC OPHTHALMIC DILATING DROPS
1.0000 | OPHTHALMIC | Status: DC | PRN
  Administered 2022-02-28 (×3): 1 via OPHTHALMIC

## 2022-02-28 SURGICAL SUPPLY — 13 items
CATARACT SUITE SIGHTPATH (MISCELLANEOUS) ×1 IMPLANT
DISSECTOR HYDRO NUCLEUS 50X22 (MISCELLANEOUS) ×1 IMPLANT
FEE CATARACT SUITE SIGHTPATH (MISCELLANEOUS) ×1 IMPLANT
GLOVE SURG GAMMEX PI TX LF 7.5 (GLOVE) ×1 IMPLANT
GLOVE SURG SYN 8.5  E (GLOVE) ×1
GLOVE SURG SYN 8.5 E (GLOVE) ×1 IMPLANT
GLOVE SURG SYN 8.5 PF PI (GLOVE) ×1 IMPLANT
LENS IOL TECNIS EYHANCE 15.5 (Intraocular Lens) IMPLANT
NDL FILTER BLUNT 18X1 1/2 (NEEDLE) ×1 IMPLANT
NEEDLE FILTER BLUNT 18X1 1/2 (NEEDLE) ×1 IMPLANT
SYR 3ML LL SCALE MARK (SYRINGE) ×1 IMPLANT
SYR 5ML LL (SYRINGE) ×1 IMPLANT
WATER STERILE IRR 250ML POUR (IV SOLUTION) ×1 IMPLANT

## 2022-02-28 NOTE — Transfer of Care (Signed)
Immediate Anesthesia Transfer of Care Note  Patient: Jessica Bradshaw  Procedure(s) Performed: CATARACT EXTRACTION PHACO AND INTRAOCULAR LENS PLACEMENT (IOC) RIGHT  7.63  00:42.3 (Right: Eye)  Patient Location: PACU  Anesthesia Type: MAC  Level of Consciousness: awake, alert  and patient cooperative  Airway and Oxygen Therapy: Patient Spontanous Breathing and Patient connected to supplemental oxygen  Post-op Assessment: Post-op Vital signs reviewed, Patient's Cardiovascular Status Stable, Respiratory Function Stable, Patent Airway and No signs of Nausea or vomiting  Post-op Vital Signs: Reviewed and stable  Complications: No notable events documented.

## 2022-02-28 NOTE — H&P (Signed)
[facility]   Primary Care Physician:  Dion Body, MD Ophthalmologist: Dr. Benay Pillow  Pre-Procedure History & Physical: HPI:  Jessica Bradshaw is a 61 y.o. female here for cataract surgery.   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 Authorizing Provider  cetirizine (ZYRTEC) 10 MG tablet Take 10 mg by mouth daily.   Yes [provider]  Cholecalciferol (VITAMIN D) 2000 units CAPS Take by mouth.   Yes [provider]  citalopram (CELEXA) 20 MG tablet Take 20 mg by mouth daily.   Yes [provider]  Cranberry 4200 MG CAPS Take by mouth daily.   Yes [provider]  hydrochlorothiazide (MICROZIDE) 12.5 MG capsule Take 12.5 mg by mouth daily.   Yes [provider]  LORazepam (ATIVAN) 0.5 MG tablet Take 0.5 mg by mouth every 8 (eight) hours as needed for anxiety.   Yes [provider]  metoprolol succinate (TOPROL-XL) 50 MG 24 hr tablet TAKE 1 TABLET (50 MG TOTAL) BY MOUTH DAILY. TAKE WITH OR IMMEDIATELY FOLLOWING A MEAL. 06/14/17  Yes Mar Daring, PA-C  Misc Natural Products (DETOX PO) Take by mouth daily.   Yes [provider]  octreotide (SANDOSTATIN LAR) 30 MG injection Inject 30 mg into the muscle every 28 (twenty-eight) days.   Yes [provider]  octreotide (SANDOSTATIN) 100 MCG/ML SOLN injection Inject 100 mcg into the skin as needed.   Yes [provider]   Probiotic Product (PROBIOTIC DAILY PO) Take by mouth daily.   Yes [provider]  ALPRAZolam (XANAX) 0.5 MG tablet Take 1 tablet (0.5 mg total) by mouth 2 (two) times daily as needed for anxiety. Patient not taking: Reported on 02/22/2022 09/30/14   Margarita Rana, MD  cabozantinib (CABOMETYX) 40 MG tablet Take 40 mg by mouth daily. Take on an empty stomach, 1 hour before or 2 hours after meals. Patient not taking: Reported on 02/22/2022    [provider]  capecitabine (XELODA) 500 MG tablet Take by mouth 2 (two) times daily after a meal. Patient not taking: Reported on 02/22/2022    [provider]  HYDROmorphone (DILAUDID) 2 MG tablet Take by mouth every 4 (four) hours as needed for severe pain. Patient not taking: Reported on 02/22/2022    [provider]  morphine (MSIR) 15 MG tablet Take 15 mg by mouth every 4 (four) hours as needed for severe pain. Patient not taking: Reported on 02/22/2022    [provider]  ondansetron (ZOFRAN) 8 MG tablet Take 8 mg by mouth every 8 (eight) hours as needed for nausea or vomiting. Patient not taking: Reported on 02/22/2022    [provider]  ondansetron (ZOFRAN-ODT) 4 MG disintegrating tablet Take 4 mg by mouth every 8 (eight) hours as needed for nausea or vomiting. Patient not taking: Reported on 02/22/2022    [provider]  Allergies as of 01/31/2022 - Review Complete 08/02/2018  Allergen Reaction Noted   Esomeprazole  02/23/2015   Lisinopril Cough 09/15/2015    Family History  Problem Relation Age of Onset   Osteoporosis Mother    Diabetes Father    Heart disease Father    Congestive Heart Failure Father    Hypertension Father    Stroke Father    Heart attack Father    Glaucoma Father    AAA (abdominal aortic aneurysm) Son    HIV Brother    Hypertension Brother    Hypertension Brother    Drug abuse Maternal Uncle    Breast cancer Neg Hx     Social History    Socioeconomic History   Marital status: Married    Spouse name: Not on file   Number of children: 3   Years of education: College   Highest education level: Not on file  Occupational History   Occupation: Part Time  Tobacco Use   Smoking status: Never   Smokeless tobacco: Never  Vaping Use   Vaping Use: Never used  Substance and Sexual Activity   Alcohol use: No    Comment: socially   Drug use: No   Sexual activity: Yes    Birth control/protection: Surgical  Other Topics Concern   Not on file  Social History Narrative   Not on file   Social Determinants of Health   Financial Resource Strain: Not on file  Food Insecurity: Not on file  Transportation Needs: Not on file  Physical Activity: Not on file  Stress: Not on file  Social Connections: Not on file  Intimate Partner Violence: Not on file    Review of Systems: See HPI, otherwise negative ROS  Physical Exam: BP (!) 141/90   Pulse 67   Resp 17   Ht '5\' 5"'$  (1.651 m)   Wt 69.4 kg   SpO2 97%   BMI 25.44 kg/m  General:   Alert, cooperative in NAD Head:  Normocephalic and atraumatic. Respiratory:  Normal work of breathing. Cardiovascular:  RRR  Impression/Plan: Jessica Bradshaw is here for cataract surgery.  Risks, benefits, limitations, and alternatives regarding cataract surgery have been reviewed with the patient.  Questions have been answered.  All parties agreeable.   Benay Pillow, MD  02/28/2022, 10:59 AM

## 2022-02-28 NOTE — Anesthesia Preprocedure Evaluation (Signed)
Anesthesia Evaluation  Patient identified by MRN, date of birth, ID band Patient awake    Reviewed: Allergy & Precautions, NPO status , Patient's Chart, lab work & pertinent test results  History of Anesthesia Complications Negative for: history of anesthetic complications  Airway Mallampati: III  TM Distance: >3 FB Neck ROM: full    Dental  (+) Chipped   Pulmonary neg pulmonary ROS, neg shortness of breath, neg COPD   Pulmonary exam normal        Cardiovascular hypertension, (-) Past MI and (-) CABG negative cardio ROS Normal cardiovascular exam     Neuro/Psych negative neurological ROS  negative psych ROS   GI/Hepatic negative GI ROS, Neg liver ROS,,,  Endo/Other  negative endocrine ROS    Renal/GU      Musculoskeletal   Abdominal   Peds  Hematology negative hematology ROS (+)   Anesthesia Other Findings Past Medical History: No date: Allergy 2018: Cancer ([place])     Comment:  Neuroendocrine CA (recent diagnosis) metastasized to               liver No date: Hx of dysplastic nevus     Comment:  multiple sites No date: Hypertension No date: Motion sickness     Comment:  cars, boats 2018: Personal history of radiation therapy  Past Surgical History: 2007: ABDOMINAL HYSTERECTOMY 1985: CESAREAN SECTION 2001: HIATAL HERNIA REPAIR No date: TONSILLECTOMY 2011: transoral incisionless fundoplication  BMI    Body Mass Index: 25.44 kg/m      Reproductive/Obstetrics negative OB ROS                             Anesthesia Physical Anesthesia Plan  ASA: 2  Anesthesia Plan: MAC   Post-op Pain Management:    Induction: Intravenous  PONV Risk Score and Plan: 2  Airway Management Planned: Natural Airway and Nasal Cannula  Additional Equipment:   Intra-op Plan:   Post-operative Plan:   Informed Consent: I have reviewed the patients History and Physical, chart, labs and  discussed the procedure including the risks, benefits and alternatives for the proposed anesthesia with the patient or authorized representative who has indicated his/her understanding and acceptance.     Dental Advisory Given  Plan Discussed with: Anesthesiologist, CRNA and Surgeon  Anesthesia Plan Comments: (Patient consented for risks of anesthesia including but not limited to:  - adverse reactions to medications - damage to eyes, teeth, lips or other oral mucosa - nerve damage due to positioning  - sore throat or hoarseness - Damage to heart, brain, nerves, lungs, other parts of body or loss of life  Patient voiced understanding.)       Anesthesia Quick Evaluation

## 2022-02-28 NOTE — Op Note (Signed)
OPERATIVE NOTE  Jessica Bradshaw [id number] 02/28/2022   PREOPERATIVE DIAGNOSIS:  Nuclear sclerotic cataract right eye.  H25.11   POSTOPERATIVE DIAGNOSIS:    Nuclear sclerotic cataract right eye.     PROCEDURE:  Phacoemusification with posterior chamber intraocular lens placement of the right eye   LENS:   Implant Name Type Inv. Item Serial No. Manufacturer Lot No. LRB No. Used Action  LENS IOL TECNIS EYHANCE 15.5 - [id number] Intraocular Lens LENS IOL TECNIS EYHANCE 15.5 [id number] [employer]  Right 1 Implanted       Procedure(s): CATARACT EXTRACTION PHACO AND INTRAOCULAR LENS PLACEMENT (IOC) RIGHT  7.63  00:42.3 (Right)  DIB00 +15.5   SURGEON:  Benay Pillow, MD, MPH  ANESTHESIOLOGIST: Anesthesiologist: Dimas Millin, MD CRNA: Jacqualin Combes, CRNA   ANESTHESIA:  Topical with tetracaine drops augmented with 1% preservative-free intracameral lidocaine.  ESTIMATED BLOOD LOSS: less than 1 mL.   COMPLICATIONS:  None.   DESCRIPTION OF PROCEDURE:  The patient was identified in the holding room and transported to the operating room and placed in the supine position under the operating microscope.  The right eye was identified as the operative eye and it was prepped and draped in the usual sterile ophthalmic fashion.   A 1.0 millimeter clear-corneal paracentesis was made at the 10:30 position. 0.5 ml of preservative-free 1% lidocaine with epinephrine was injected into the anterior chamber.  The anterior chamber was filled with viscoelastic.  A 2.4 millimeter keratome was used to make a near-clear corneal incision at the 8:00 position.  A curvilinear capsulorrhexis was made with a cystotome and capsulorrhexis forceps.  Balanced salt solution was used to hydrodissect and hydrodelineate the nucleus.   Phacoemulsification was then used in stop and chop fashion to remove the lens nucleus and epinucleus.  The remaining cortex was then removed using the irrigation and aspiration  handpiece.   There was substantial posterior capsular fibrosis that could not be removed, most of this was in the midperiphery, of uncertain visual significance.  Viscoelastic was then placed into the capsular bag to distend it for lens placement.  A lens was then injected into the capsular bag.  The remaining viscoelastic was aspirated.   Wounds were hydrated with balanced salt solution.  The anterior chamber was inflated to a physiologic pressure with balanced salt solution.   Intracameral vigamox 0.1 mL undiluted was injected into the eye and a drop placed onto the ocular surface.  No wound leaks were noted.  The patient was taken to the recovery room in stable condition without complications of anesthesia or surgery  Benay Pillow 02/28/2022, 11:42 AM

## 2022-02-28 NOTE — Anesthesia Postprocedure Evaluation (Signed)
Anesthesia Post Note  Patient: Jessica Bradshaw  Procedure(s) Performed: CATARACT EXTRACTION PHACO AND INTRAOCULAR LENS PLACEMENT (IOC) RIGHT  7.63  00:42.3 (Right: Eye)  Patient location during evaluation: PACU Anesthesia Type: MAC Level of consciousness: awake and alert Pain management: pain level controlled Vital Signs Assessment: post-procedure vital signs reviewed and stable Respiratory status: spontaneous breathing, nonlabored ventilation, respiratory function stable and patient connected to nasal cannula oxygen Cardiovascular status: stable and blood pressure returned to baseline Postop Assessment: no apparent nausea or vomiting Anesthetic complications: no  No notable events documented.   Last Vitals:  Vitals:   02/28/22 1143 02/28/22 1149  BP: (!) 156/79 (!) 144/84  Pulse: 65 65  Resp: 14 10  Temp: (!) 36.2 C (!) 36.2 C  SpO2: 98% 97%    Last Pain:  Vitals:   02/28/22 1149  TempSrc:   PainSc: 0-No pain                 Dimas Millin

## 2022-03-01 ENCOUNTER — Encounter: Payer: Self-pay | Admitting: Ophthalmology

## 2022-04-05 ENCOUNTER — Other Ambulatory Visit: Payer: Self-pay | Admitting: Family Medicine

## 2022-04-05 DIAGNOSIS — Z1231 Encounter for screening mammogram for malignant neoplasm of breast: Secondary | ICD-10-CM

## 2022-04-19 ENCOUNTER — Ambulatory Visit
Admission: RE | Admit: 2022-04-19 | Discharge: 2022-04-19 | Disposition: A | Discharge status: Home / Self Care | Payer: Medicare Other | Source: Ambulatory Visit | Attending: Family Medicine | Admitting: Family Medicine

## 2022-04-19 DIAGNOSIS — Z1231 Encounter for screening mammogram for malignant neoplasm of breast: Secondary | ICD-10-CM | POA: Insufficient documentation

## 2023-05-18 ENCOUNTER — Other Ambulatory Visit
Admission: RE | Admit: 2023-05-18 | Discharge: 2023-05-18 | Disposition: A | Discharge status: Home / Self Care | Payer: Medicare Other | Source: Ambulatory Visit | Attending: Family Medicine | Admitting: Family Medicine

## 2023-05-18 DIAGNOSIS — R4182 Altered mental status, unspecified: Secondary | ICD-10-CM | POA: Insufficient documentation

## 2023-05-18 LAB — AMMONIA: Ammonia: 49 umol/L — ABNORMAL HIGH (ref 9–35)

## 2023-09-24 DEATH — deceased
